# Patient Record
Sex: Female | Born: 1940 | Race: White | Hispanic: No | Smoking: Former smoker
Health system: Southern US, Community
[De-identification: ages and names within clinical notes are randomized; demographics above are authoritative.]

## PROBLEM LIST (undated history)

## (undated) DIAGNOSIS — M1990 Unspecified osteoarthritis, unspecified site: Secondary | ICD-10-CM

## (undated) DIAGNOSIS — T7840XA Allergy, unspecified, initial encounter: Secondary | ICD-10-CM

## (undated) HISTORY — DX: Allergy, unspecified, initial encounter: T78.40XA

## (undated) HISTORY — PX: RECTAL PROLAPSE REPAIR: SHX759

## (undated) HISTORY — PX: PERINEAL PROCTECTOMY: SHX6398

## (undated) HISTORY — PX: ABDOMINAL HYSTERECTOMY: SHX81

## (undated) HISTORY — PX: BREAST SURGERY: SHX581

## (undated) HISTORY — PX: BLADDER REPAIR: SHX76

## (undated) HISTORY — PX: TONSILLECTOMY: SUR1361

## (undated) HISTORY — DX: Unspecified osteoarthritis, unspecified site: M19.90

---

## 2007-11-21 ENCOUNTER — Encounter: Admission: RE | Admit: 2007-11-21 | Discharge: 2007-11-21 | Payer: Self-pay | Admitting: Internal Medicine

## 2008-11-27 ENCOUNTER — Ambulatory Visit: Payer: Self-pay | Admitting: Diagnostic Radiology

## 2008-11-27 ENCOUNTER — Ambulatory Visit (HOSPITAL_BASED_OUTPATIENT_CLINIC_OR_DEPARTMENT_OTHER): Admission: RE | Admit: 2008-11-27 | Discharge: 2008-11-27 | Payer: Self-pay | Admitting: Obstetrics and Gynecology

## 2009-01-14 ENCOUNTER — Emergency Department (HOSPITAL_BASED_OUTPATIENT_CLINIC_OR_DEPARTMENT_OTHER): Admission: EM | Admit: 2009-01-14 | Discharge: 2009-01-14 | Payer: Self-pay | Admitting: Emergency Medicine

## 2009-12-03 ENCOUNTER — Ambulatory Visit (HOSPITAL_BASED_OUTPATIENT_CLINIC_OR_DEPARTMENT_OTHER): Admission: RE | Admit: 2009-12-03 | Discharge: 2009-12-03 | Payer: Self-pay | Admitting: Obstetrics and Gynecology

## 2009-12-03 ENCOUNTER — Ambulatory Visit: Payer: Self-pay | Admitting: Diagnostic Radiology

## 2010-12-09 ENCOUNTER — Ambulatory Visit (HOSPITAL_BASED_OUTPATIENT_CLINIC_OR_DEPARTMENT_OTHER)
Admission: RE | Admit: 2010-12-09 | Discharge: 2010-12-09 | Payer: Self-pay | Source: Home / Self Care | Attending: Obstetrics and Gynecology | Admitting: Obstetrics and Gynecology

## 2010-12-28 ENCOUNTER — Encounter: Payer: Self-pay | Admitting: Obstetrics and Gynecology

## 2011-03-24 LAB — URINALYSIS, ROUTINE W REFLEX MICROSCOPIC
Glucose, UA: NEGATIVE mg/dL
Urobilinogen, UA: 1 mg/dL (ref 0.0–1.0)

## 2011-03-24 LAB — URINE CULTURE: Colony Count: >=100000

## 2011-03-24 LAB — URINE MICROSCOPIC-ADD ON

## 2011-11-16 ENCOUNTER — Other Ambulatory Visit (HOSPITAL_BASED_OUTPATIENT_CLINIC_OR_DEPARTMENT_OTHER): Payer: Self-pay | Admitting: Obstetrics and Gynecology

## 2011-11-16 DIAGNOSIS — Z1231 Encounter for screening mammogram for malignant neoplasm of breast: Secondary | ICD-10-CM

## 2011-12-14 ENCOUNTER — Ambulatory Visit (HOSPITAL_BASED_OUTPATIENT_CLINIC_OR_DEPARTMENT_OTHER)
Admission: RE | Admit: 2011-12-14 | Discharge: 2011-12-14 | Disposition: A | Discharge status: Home / Self Care | Payer: Medicare Other | Source: Ambulatory Visit | Attending: Obstetrics and Gynecology | Admitting: Obstetrics and Gynecology

## 2011-12-14 DIAGNOSIS — Z1231 Encounter for screening mammogram for malignant neoplasm of breast: Secondary | ICD-10-CM | POA: Insufficient documentation

## 2012-11-18 ENCOUNTER — Other Ambulatory Visit (HOSPITAL_BASED_OUTPATIENT_CLINIC_OR_DEPARTMENT_OTHER): Payer: Self-pay | Admitting: Obstetrics and Gynecology

## 2012-11-18 DIAGNOSIS — Z1231 Encounter for screening mammogram for malignant neoplasm of breast: Secondary | ICD-10-CM

## 2012-12-15 ENCOUNTER — Inpatient Hospital Stay (HOSPITAL_BASED_OUTPATIENT_CLINIC_OR_DEPARTMENT_OTHER): Admission: RE | Admit: 2012-12-15 | Payer: Medicare Other | Source: Ambulatory Visit

## 2012-12-15 ENCOUNTER — Ambulatory Visit (HOSPITAL_BASED_OUTPATIENT_CLINIC_OR_DEPARTMENT_OTHER): Payer: Medicare Other

## 2012-12-19 ENCOUNTER — Ambulatory Visit (HOSPITAL_BASED_OUTPATIENT_CLINIC_OR_DEPARTMENT_OTHER)
Admission: RE | Admit: 2012-12-19 | Discharge: 2012-12-19 | Disposition: A | Discharge status: Home / Self Care | Payer: Medicare Other | Source: Ambulatory Visit | Attending: Obstetrics and Gynecology | Admitting: Obstetrics and Gynecology

## 2012-12-19 DIAGNOSIS — Z1231 Encounter for screening mammogram for malignant neoplasm of breast: Secondary | ICD-10-CM | POA: Insufficient documentation

## 2013-11-23 ENCOUNTER — Other Ambulatory Visit (HOSPITAL_BASED_OUTPATIENT_CLINIC_OR_DEPARTMENT_OTHER): Payer: Self-pay | Admitting: Obstetrics and Gynecology

## 2013-11-23 DIAGNOSIS — Z1231 Encounter for screening mammogram for malignant neoplasm of breast: Secondary | ICD-10-CM

## 2013-12-20 ENCOUNTER — Ambulatory Visit (HOSPITAL_BASED_OUTPATIENT_CLINIC_OR_DEPARTMENT_OTHER): Payer: Medicare Other

## 2013-12-27 ENCOUNTER — Ambulatory Visit (HOSPITAL_BASED_OUTPATIENT_CLINIC_OR_DEPARTMENT_OTHER)
Admission: RE | Admit: 2013-12-27 | Discharge: 2013-12-27 | Disposition: A | Discharge status: Home / Self Care | Payer: Medicare Other | Source: Ambulatory Visit | Attending: Obstetrics and Gynecology | Admitting: Obstetrics and Gynecology

## 2013-12-27 DIAGNOSIS — Z1231 Encounter for screening mammogram for malignant neoplasm of breast: Secondary | ICD-10-CM

## 2014-04-22 ENCOUNTER — Encounter (HOSPITAL_BASED_OUTPATIENT_CLINIC_OR_DEPARTMENT_OTHER): Payer: Self-pay | Admitting: Emergency Medicine

## 2014-04-22 ENCOUNTER — Emergency Department (HOSPITAL_BASED_OUTPATIENT_CLINIC_OR_DEPARTMENT_OTHER)
Admission: EM | Admit: 2014-04-22 | Discharge: 2014-04-22 | Disposition: A | Discharge status: Home / Self Care | Payer: Medicare Other | Attending: Emergency Medicine | Admitting: Emergency Medicine

## 2014-04-22 ENCOUNTER — Emergency Department (HOSPITAL_BASED_OUTPATIENT_CLINIC_OR_DEPARTMENT_OTHER): Payer: Medicare Other

## 2014-04-22 DIAGNOSIS — W1809XA Striking against other object with subsequent fall, initial encounter: Secondary | ICD-10-CM | POA: Insufficient documentation

## 2014-04-22 DIAGNOSIS — Y929 Unspecified place or not applicable: Secondary | ICD-10-CM | POA: Insufficient documentation

## 2014-04-22 DIAGNOSIS — S2239XA Fracture of one rib, unspecified side, initial encounter for closed fracture: Secondary | ICD-10-CM | POA: Insufficient documentation

## 2014-04-22 DIAGNOSIS — Y9389 Activity, other specified: Secondary | ICD-10-CM | POA: Insufficient documentation

## 2014-04-22 MED ORDER — OXYCODONE-ACETAMINOPHEN 5-325 MG PO TABS: 1.0000 | ORAL_TABLET | Freq: Four times a day (QID) | ORAL | Status: DC | PRN

## 2014-04-22 NOTE — ED Notes (Addendum)
Patient c/o R side upper rib pain as a result of fall last Wednesday and hit her R side upper back on a door frame, no loc. Took 400mg  advil at 12:30

## 2014-04-22 NOTE — ED Provider Notes (Signed)
CSN: [id number]     Arrival date & time 04/22/14  1426 History   First MD Initiated Contact with Patient 04/22/14 1500     Chief Complaint  Patient presents with  . Fall     (Consider location/radiation/quality/duration/timing/severity/associated sxs/prior Treatment) HPI Lost balance 5 days ago hit right ribs when assisting husband; ibuprofen helping; pain constant, moderate, localized, nonradiating, no associated Sxs, no SOB, abd pain or back pain. No hematuria or bloody stools. No head or neck injury, no extremity injury.    No past medical history on file. Past Surgical History  Procedure Laterality Date  . Tonsillectomy    . Abdominal hysterectomy      aprtial   No family history on file. History  Substance Use Topics  . Smoking status: Never Smoker   . Smokeless tobacco: Not on file  . Alcohol Use: Yes   OB History   Grav Para Term Preterm Abortions TAB SAB Ect Mult Living                 Review of Systems 10 Systems reviewed and are negative for acute change except as noted in the HPI.   Allergies  Review of patient's allergies indicates no known allergies.  Home Medications   Prior to Admission medications   Medication Sig Start Date End Date Taking? Authorizing Provider  Cholecalciferol (VITAMIN D-3 PO) Take by mouth.   Yes Historical Provider, MD  Zoledronic Acid (RECLAST IV) Inject into the vein once. Yearly   Yes Historical Provider, MD   BP 159/82  Pulse 76  Temp(Src) 97.7 F (36.5 C) (Oral)  Resp 20  Ht 5\' 4"  (1.626 m)  Wt 136 lb (61.689 kg)  BMI 23.33 kg/m2  SpO2 100% Physical Exam  Nursing note and vitals reviewed. Constitutional:  Awake, alert, nontoxic appearance.  HENT:  Head: Atraumatic.  Eyes: Right eye exhibits no discharge. Left eye exhibits no discharge.  Neck: Neck supple.  C-S NT  Cardiovascular: Normal rate and regular rhythm.   No murmur heard. Pulmonary/Chest: Effort normal and breath sounds normal. No respiratory  distress. She has no wheezes. She has no rales. She exhibits tenderness.  tender right lower chest wall posterolateral; pulse ox normal room air 100%  Abdominal: Soft. Bowel sounds are normal. She exhibits no distension. There is no tenderness. There is no rebound and no guarding.  Musculoskeletal: She exhibits no tenderness.  Baseline ROM, no obvious new focal weakness. Back NT.  Neurological:  Mental status and motor strength appears baseline for patient and situation.  Skin: No rash noted.  Psychiatric: She has a normal mood and affect.    ED Course  Procedures (including critical care time) Patient / Family / Caregiver informed of clinical course, understand medical decision-making process, and agree with plan. Labs Review Labs Reviewed - No data to display  Imaging Review Dg Chest 2 View  04/22/2014   CLINICAL DATA:  Status post fall  EXAM: CHEST  2 VIEW  COMPARISON:  None  FINDINGS: The heart size and mediastinal contours are within normal limits. Moderate hiatal hernia noted. Both lungs are clear. The visualized skeletal structures are unremarkable.  IMPRESSION: 1. No acute findings. 2. Hiatal hernia.   Electronically Signed   By: Kerby Moors M.D.   On: 04/22/2014 15:49     EKG Interpretation None      MDM   Final diagnoses:  Rib fracture    I doubt any other EMC precluding discharge at this time including, but not  necessarily limited to the following:PTX, abdominal injury.    Babette Relic, MD 04/24/14 [phone]

## 2014-04-22 NOTE — Discharge Instructions (Signed)
Rib Fracture A rib fracture is a break or crack in one of the bones of the ribs. The ribs are like a cage that goes around your upper chest. A broken or cracked rib is often painful, but most do not cause other problems. Most rib fractures heal on their own in 1 3 months. HOME CARE  Avoid activities that cause pain to the injured area. Protect your injured area.  Slowly increase activity as told by your doctor.  Take medicine as told by your doctor.  Put ice on the injured area for the first 1 2 days after you have been treated or as told by your doctor.  Put ice in a plastic bag.  Place a towel between your skin and the bag.  Leave the ice on for 15 20 minutes at a time, every 2 hours while you are awake.  Do deep breathing as told by your doctor. You may be told to:  Take deep breaths many times a day.  Cough many times a day while hugging a pillow.  Use a device (incentive spirometer) to perform deep breathing many times a day.  Drink enough fluids to keep your pee (urine) clear or pale yellow.   Do not wear a rib belt or binder. These do not allow you to breathe deeply. GET HELP RIGHT AWAY IF:   You have a fever.  You have trouble breathing.   You cannot stop coughing.  You cough up thick or bloody spit (mucus).   You feel sick to your stomach (nauseous), throw up (vomit), or have belly (abdominal) pain.   Your pain gets worse and medicine does not help.  MAKE SURE YOU:   Understand these instructions.  Will watch your condition.  Will get help right away if you are not doing well or get worse. Document Released: 09/01/2008 Document Revised: 03/20/2013 Document Reviewed: 01/25/2013 [facility] Patient Information 2014 [place], [place].

## 2014-12-10 ENCOUNTER — Other Ambulatory Visit (HOSPITAL_BASED_OUTPATIENT_CLINIC_OR_DEPARTMENT_OTHER): Payer: Self-pay | Admitting: Internal Medicine

## 2014-12-10 DIAGNOSIS — Z1231 Encounter for screening mammogram for malignant neoplasm of breast: Secondary | ICD-10-CM

## 2014-12-28 ENCOUNTER — Ambulatory Visit (HOSPITAL_BASED_OUTPATIENT_CLINIC_OR_DEPARTMENT_OTHER): Payer: Medicare Other

## 2015-01-01 ENCOUNTER — Ambulatory Visit (HOSPITAL_BASED_OUTPATIENT_CLINIC_OR_DEPARTMENT_OTHER)
Admission: RE | Admit: 2015-01-01 | Discharge: 2015-01-01 | Disposition: A | Discharge status: Home / Self Care | Payer: Medicare Other | Source: Ambulatory Visit | Attending: Internal Medicine | Admitting: Internal Medicine

## 2015-01-01 DIAGNOSIS — Z1231 Encounter for screening mammogram for malignant neoplasm of breast: Secondary | ICD-10-CM | POA: Insufficient documentation

## 2015-11-25 ENCOUNTER — Other Ambulatory Visit (HOSPITAL_BASED_OUTPATIENT_CLINIC_OR_DEPARTMENT_OTHER): Payer: Self-pay | Admitting: Obstetrics and Gynecology

## 2015-11-25 DIAGNOSIS — Z1231 Encounter for screening mammogram for malignant neoplasm of breast: Secondary | ICD-10-CM

## 2016-01-06 ENCOUNTER — Ambulatory Visit (HOSPITAL_BASED_OUTPATIENT_CLINIC_OR_DEPARTMENT_OTHER)
Admission: RE | Admit: 2016-01-06 | Discharge: 2016-01-06 | Disposition: A | Discharge status: Home / Self Care | Payer: Medicare Other | Source: Ambulatory Visit | Attending: Obstetrics and Gynecology | Admitting: Obstetrics and Gynecology

## 2016-01-06 DIAGNOSIS — Z1231 Encounter for screening mammogram for malignant neoplasm of breast: Secondary | ICD-10-CM | POA: Diagnosis not present

## 2016-04-30 DIAGNOSIS — K580 Irritable bowel syndrome with diarrhea: Secondary | ICD-10-CM | POA: Insufficient documentation

## 2016-12-10 DIAGNOSIS — N993 Prolapse of vaginal vault after hysterectomy: Secondary | ICD-10-CM | POA: Insufficient documentation

## 2017-04-07 ENCOUNTER — Other Ambulatory Visit (HOSPITAL_BASED_OUTPATIENT_CLINIC_OR_DEPARTMENT_OTHER): Payer: Self-pay | Admitting: Obstetrics and Gynecology

## 2017-04-07 DIAGNOSIS — Z1231 Encounter for screening mammogram for malignant neoplasm of breast: Secondary | ICD-10-CM

## 2017-04-13 ENCOUNTER — Ambulatory Visit (HOSPITAL_BASED_OUTPATIENT_CLINIC_OR_DEPARTMENT_OTHER)
Admission: RE | Admit: 2017-04-13 | Discharge: 2017-04-13 | Disposition: A | Discharge status: Home / Self Care | Payer: Medicare PPO | Source: Ambulatory Visit | Attending: Obstetrics and Gynecology | Admitting: Obstetrics and Gynecology

## 2017-04-13 DIAGNOSIS — Z1231 Encounter for screening mammogram for malignant neoplasm of breast: Secondary | ICD-10-CM | POA: Insufficient documentation

## 2017-09-28 ENCOUNTER — Encounter (HOSPITAL_BASED_OUTPATIENT_CLINIC_OR_DEPARTMENT_OTHER): Payer: Self-pay | Admitting: *Deleted

## 2017-09-28 ENCOUNTER — Emergency Department (HOSPITAL_BASED_OUTPATIENT_CLINIC_OR_DEPARTMENT_OTHER)
Admission: EM | Admit: 2017-09-28 | Discharge: 2017-09-28 | Disposition: A | Discharge status: Home / Self Care | Payer: Medicare PPO | Attending: Emergency Medicine | Admitting: Emergency Medicine

## 2017-09-28 DIAGNOSIS — I10 Essential (primary) hypertension: Secondary | ICD-10-CM

## 2017-09-28 DIAGNOSIS — R030 Elevated blood-pressure reading, without diagnosis of hypertension: Secondary | ICD-10-CM | POA: Diagnosis not present

## 2017-09-28 NOTE — ED Provider Notes (Signed)
TIME SEEN: 11:22 PM  CHIEF COMPLAINT: "my blood pressure is high"  HPI: Pt is a 76 year old female with no significant past medical history who presents to the emergency department with concerns of hypertension. She states that she checks her blood pressure normally every other day and is normally in the 120s to 140s/70s to 90s. She is not on a medication for her blood pressure. She states she is feeling well. Has had a mild "cold" and has had some head "tightness". No severe headache. No vision changes. No chest pain or shortness of breath. No numbness or focal weakness. She is not on medication for her blood pressure. States tonight when she checked her blood pressure was elevated in the 170s/100s and this made her concerned. She called her friend who lives several doors down from her who came over with her own blood pressure machine and they checked it again. She reports after checking it several times it continued to go up and they decided to come to the emergency department.  ROS: See HPI Constitutional: no fever  Eyes: no drainage  ENT: no runny nose   Cardiovascular:  no chest pain  Resp: no SOB  GI: no vomiting GU: no dysuria Integumentary: no rash  Allergy: no hives  Musculoskeletal: no leg swelling  Neurological: no slurred speech ROS otherwise negative  PAST MEDICAL HISTORY/PAST SURGICAL HISTORY:  History reviewed. No pertinent past medical history.  MEDICATIONS:  Prior to Admission medications   Medication Sig Start Date End Date Taking? Authorizing Provider  Cholecalciferol (VITAMIN D-3 PO) Take by mouth.   Yes [provider]  Zoledronic Acid (RECLAST IV) Inject into the vein once. Yearly   Yes [provider]  oxyCODONE-acetaminophen (PERCOCET) 5-325 MG per tablet Take 1 tablet by mouth every 6 (six) hours as needed for severe pain. 04/22/14   Riki Altes, MD    ALLERGIES:  No Known Allergies  SOCIAL HISTORY:  Social History  Substance Use Topics   . Smoking status: Never Smoker  . Smokeless tobacco: Never Used  . Alcohol use Yes    FAMILY HISTORY: No family history on file.  EXAM: BP (!) 197/118   Pulse 93   Temp 97.8 F (36.6 C) (Oral)   Resp 18   Ht 5\' 5"  (1.651 m)   Wt 60.8 kg (134 lb)   SpO2 96%   BMI 22.30 kg/m  CONSTITUTIONAL: Alert and oriented and responds appropriately to questions. Well-appearing; well-nourished, elderly, extremely well-appearing, smiling and laughing, in no distress HEAD: Normocephalic EYES: Conjunctivae clear, pupils appear equal, EOMI ENT: normal nose; moist mucous membranes NECK: Supple, no meningismus, no nuchal rigidity, no LAD  CARD: RRR; S1 and S2 appreciated; no murmurs, no clicks, no rubs, no gallops RESP: Normal chest excursion without splinting or tachypnea; breath sounds clear and equal bilaterally; no wheezes, no rhonchi, no rales, no hypoxia or respiratory distress, speaking full sentences ABD/GI: Normal bowel sounds; non-distended; soft, non-tender, no rebound, no guarding, no peritoneal signs, no hepatosplenomegaly BACK:  The back appears normal and is non-tender to palpation, there is no CVA tenderness EXT: Normal ROM in all joints; non-tender to palpation; no edema; normal capillary refill; no cyanosis, no calf tenderness or swelling    SKIN: Normal color for age and race; warm; no rash NEURO: Moves all extremities equally; Strength 5/5 in all four extremities.  Normal sensation diffusely.  CN 2-12 grossly intact.  No dysmetria to finger to nose testing bilaterally.  Normal speech.  Normal gait. PSYCH: The  patient's mood and manner are appropriate. Grooming and personal hygiene are appropriate.  MEDICAL DECISION MAKING: Pt here with asymptomatic hypertension. EKG obtained in triage shows no arrhythmia, ischemic abnormality.  Patient is extremely well-appearing, neurologically intact. I do not feel at this time she needs further workup. Her blood pressure continues to improve on  its own especially after reassurance. Blood pressure is now 158/84. Discussed with patient that given she regularly has normal blood pressures and do not feel that we should start her on blood pressure medication for this isolated episode of hypertension. Recommended close follow-up with her primary care doctor for this and that she check her blood pressure only once a day in the morning when she is feeling well. Stressed with her that it is normal to have your blood pressure continued to go up after checking it many times in a row and with her being anxious about it. I discussed at length return precautions with patient and her friend. Discussed return precautions. I do not feel this time she needs labs or urine as I'm not concerned for any end organ damage from 1 day of slightly elevated pressure.  She states that yesterday her blood pressure was normal and that earlier today it was also in the 130s/80s.  I discussed with her that if we were to start her on blood pressure medication for these isolated episodes of hypertension that we can actually make her hypotensive which could be very dangerous for her. She is comfortable with this plan for close outpatient follow-up.   At this time, I do not feel there is any life-threatening condition present. I have reviewed and discussed all results (EKG, imaging, lab, urine as appropriate) and exam findings with patient/family. I have reviewed nursing notes and appropriate previous records.  I feel the patient is safe to be discharged home without further emergent workup and can continue workup as an outpatient as needed. Discussed usual and customary return precautions. Patient/family verbalize understanding and are comfortable with this plan.  Outpatient follow-up has been provided if needed. All questions have been answered.      EKG Interpretation  Date/Time:  Tuesday September 28 2017 22:57:56 EDT Ventricular Rate:  87 PR Interval:    QRS Duration: 100 QT  Interval:  394 QTC Calculation: 474 R Axis:   36 Text Interpretation:  Sinus rhythm No old tracing to compare Confirmed by Ward, Cyril Mourning [phone]) on 09/28/2017 11:02:00 PM         Ward, Delice Bison, DO 09/29/17 0814

## 2017-09-28 NOTE — ED Triage Notes (Signed)
States she took her BP at home and it was elevated. Her head has been stuffy x 2 days. No other symptoms. No hx of HTN.

## 2018-01-28 DIAGNOSIS — K623 Rectal prolapse: Secondary | ICD-10-CM | POA: Insufficient documentation

## 2018-03-28 ENCOUNTER — Other Ambulatory Visit (HOSPITAL_BASED_OUTPATIENT_CLINIC_OR_DEPARTMENT_OTHER): Payer: Self-pay | Admitting: Obstetrics and Gynecology

## 2018-03-28 DIAGNOSIS — Z1231 Encounter for screening mammogram for malignant neoplasm of breast: Secondary | ICD-10-CM

## 2018-04-28 ENCOUNTER — Ambulatory Visit (HOSPITAL_BASED_OUTPATIENT_CLINIC_OR_DEPARTMENT_OTHER)
Admission: RE | Admit: 2018-04-28 | Discharge: 2018-04-28 | Disposition: A | Discharge status: Home / Self Care | Payer: Medicare PPO | Source: Ambulatory Visit | Attending: Obstetrics and Gynecology | Admitting: Obstetrics and Gynecology

## 2018-04-28 DIAGNOSIS — Z1231 Encounter for screening mammogram for malignant neoplasm of breast: Secondary | ICD-10-CM | POA: Insufficient documentation

## 2018-05-06 DIAGNOSIS — M19019 Primary osteoarthritis, unspecified shoulder: Secondary | ICD-10-CM | POA: Insufficient documentation

## 2018-05-30 DIAGNOSIS — D509 Iron deficiency anemia, unspecified: Secondary | ICD-10-CM | POA: Insufficient documentation

## 2019-05-05 ENCOUNTER — Other Ambulatory Visit (HOSPITAL_BASED_OUTPATIENT_CLINIC_OR_DEPARTMENT_OTHER): Payer: Self-pay | Admitting: Psychology

## 2019-05-05 ENCOUNTER — Other Ambulatory Visit (HOSPITAL_BASED_OUTPATIENT_CLINIC_OR_DEPARTMENT_OTHER): Payer: Self-pay | Admitting: Obstetrics and Gynecology

## 2019-05-05 ENCOUNTER — Other Ambulatory Visit (HOSPITAL_BASED_OUTPATIENT_CLINIC_OR_DEPARTMENT_OTHER): Payer: Self-pay | Admitting: Internal Medicine

## 2019-05-05 DIAGNOSIS — Z1231 Encounter for screening mammogram for malignant neoplasm of breast: Secondary | ICD-10-CM

## 2019-05-15 ENCOUNTER — Encounter (HOSPITAL_BASED_OUTPATIENT_CLINIC_OR_DEPARTMENT_OTHER): Payer: Self-pay

## 2019-05-15 ENCOUNTER — Ambulatory Visit (HOSPITAL_BASED_OUTPATIENT_CLINIC_OR_DEPARTMENT_OTHER)
Admission: RE | Admit: 2019-05-15 | Discharge: 2019-05-15 | Disposition: A | Discharge status: Home / Self Care | Payer: Medicare HMO | Source: Ambulatory Visit | Attending: Obstetrics and Gynecology | Admitting: Obstetrics and Gynecology

## 2019-05-15 ENCOUNTER — Other Ambulatory Visit: Payer: Self-pay

## 2019-05-15 DIAGNOSIS — Z1231 Encounter for screening mammogram for malignant neoplasm of breast: Secondary | ICD-10-CM | POA: Insufficient documentation

## 2019-07-11 DIAGNOSIS — N189 Chronic kidney disease, unspecified: Secondary | ICD-10-CM | POA: Insufficient documentation

## 2019-07-11 DIAGNOSIS — D649 Anemia, unspecified: Secondary | ICD-10-CM | POA: Insufficient documentation

## 2019-07-11 DIAGNOSIS — R030 Elevated blood-pressure reading, without diagnosis of hypertension: Secondary | ICD-10-CM | POA: Insufficient documentation

## 2019-07-11 DIAGNOSIS — I10 Essential (primary) hypertension: Secondary | ICD-10-CM | POA: Insufficient documentation

## 2019-07-11 DIAGNOSIS — E559 Vitamin D deficiency, unspecified: Secondary | ICD-10-CM | POA: Insufficient documentation

## 2019-09-28 DIAGNOSIS — E785 Hyperlipidemia, unspecified: Secondary | ICD-10-CM | POA: Insufficient documentation

## 2019-09-28 DIAGNOSIS — E7800 Pure hypercholesterolemia, unspecified: Secondary | ICD-10-CM | POA: Insufficient documentation

## 2020-01-03 ENCOUNTER — Ambulatory Visit: Payer: Medicare HMO

## 2020-01-12 ENCOUNTER — Ambulatory Visit: Payer: Medicare HMO | Attending: Internal Medicine

## 2020-01-12 DIAGNOSIS — Z23 Encounter for immunization: Secondary | ICD-10-CM | POA: Insufficient documentation

## 2020-01-12 NOTE — Progress Notes (Signed)
   Covid-19 Vaccination Clinic  Name:  Zhanna Baal    MRN: [id number] DOB: May 12, 1941  01/12/2020  Ms. Hiney was observed post Covid-19 immunization for 15 minutes without incidence. She was provided with Vaccine Information Sheet and instruction to access the V-Safe system.   Ms. Roeder was instructed to call 911 with any severe reactions post vaccine: Marland Kitchen Difficulty breathing  . Swelling of your face and throat  . A fast heartbeat  . A bad rash all over your body  . Dizziness and weakness    Immunizations Administered    Name Date Dose VIS Date Route   Pfizer COVID-19 Vaccine 01/12/2020 12:53 PM 0.3 mL 11/17/2019 Intramuscular   Manufacturer: [place]   Lot: [id number]   [place]: [id number]

## 2020-01-24 ENCOUNTER — Ambulatory Visit: Payer: Medicare HMO

## 2020-02-06 ENCOUNTER — Ambulatory Visit: Payer: Medicare HMO | Attending: Internal Medicine

## 2020-02-06 DIAGNOSIS — Z23 Encounter for immunization: Secondary | ICD-10-CM | POA: Insufficient documentation

## 2020-02-06 NOTE — Progress Notes (Signed)
   Covid-19 Vaccination Clinic  Name:  Shelley Snyder    MRN: [id number] DOB: 1940-12-17  02/06/2020  [name] was observed post Covid-19 immunization for 15 minutes without incident. She was provided with Vaccine Information Sheet and instruction to access the V-Safe system.   [name] was instructed to call 911 with any severe reactions post vaccine: Marland Kitchen Difficulty breathing  . Swelling of face and throat  . A fast heartbeat  . A bad rash all over body  . Dizziness and weakness   Immunizations Administered    Name Date Dose VIS Date Route   Pfizer COVID-19 Vaccine 02/06/2020  1:02 PM 0.3 mL 11/17/2019 Intramuscular   Manufacturer: [place]   Lot: [id number]   [place]: [id number]

## 2020-04-05 ENCOUNTER — Other Ambulatory Visit (HOSPITAL_BASED_OUTPATIENT_CLINIC_OR_DEPARTMENT_OTHER): Payer: Self-pay | Admitting: Internal Medicine

## 2020-04-05 ENCOUNTER — Other Ambulatory Visit (HOSPITAL_BASED_OUTPATIENT_CLINIC_OR_DEPARTMENT_OTHER): Payer: Self-pay | Admitting: Obstetrics and Gynecology

## 2020-04-05 DIAGNOSIS — Z1231 Encounter for screening mammogram for malignant neoplasm of breast: Secondary | ICD-10-CM

## 2020-05-22 ENCOUNTER — Ambulatory Visit (HOSPITAL_BASED_OUTPATIENT_CLINIC_OR_DEPARTMENT_OTHER): Payer: Medicare HMO

## 2020-05-28 ENCOUNTER — Ambulatory Visit (HOSPITAL_BASED_OUTPATIENT_CLINIC_OR_DEPARTMENT_OTHER)
Admission: RE | Admit: 2020-05-28 | Discharge: 2020-05-28 | Disposition: A | Discharge status: Home / Self Care | Payer: Medicare HMO | Source: Ambulatory Visit | Attending: Obstetrics and Gynecology | Admitting: Obstetrics and Gynecology

## 2020-05-28 ENCOUNTER — Other Ambulatory Visit: Payer: Self-pay

## 2020-05-28 DIAGNOSIS — Z1231 Encounter for screening mammogram for malignant neoplasm of breast: Secondary | ICD-10-CM | POA: Diagnosis not present

## 2020-10-22 ENCOUNTER — Other Ambulatory Visit: Payer: Self-pay

## 2020-10-24 ENCOUNTER — Encounter: Payer: Self-pay | Admitting: Family Medicine

## 2020-10-24 ENCOUNTER — Other Ambulatory Visit: Payer: Self-pay

## 2020-10-24 ENCOUNTER — Ambulatory Visit (INDEPENDENT_AMBULATORY_CARE_PROVIDER_SITE_OTHER): Payer: Medicare HMO | Admitting: Family Medicine

## 2020-10-24 VITALS — BP 124/80 | HR 68 | Temp 98.1°F | Ht 63.0 in | Wt 134.0 lb

## 2020-10-24 DIAGNOSIS — M810 Age-related osteoporosis without current pathological fracture: Secondary | ICD-10-CM | POA: Diagnosis not present

## 2020-10-24 DIAGNOSIS — Z1321 Encounter for screening for nutritional disorder: Secondary | ICD-10-CM | POA: Diagnosis not present

## 2020-10-24 DIAGNOSIS — Z13220 Encounter for screening for lipoid disorders: Secondary | ICD-10-CM

## 2020-10-24 DIAGNOSIS — Z862 Personal history of diseases of the blood and blood-forming organs and certain disorders involving the immune mechanism: Secondary | ICD-10-CM | POA: Diagnosis not present

## 2020-10-24 LAB — CBC
HCT: 39.6 % (ref 36.0–46.0)
Hemoglobin: 13 g/dL (ref 12.0–15.0)
MCHC: 33 g/dL (ref 30.0–36.0)
MCV: 94.8 fl (ref 78.0–100.0)
Platelets: 194 10*3/uL (ref 150.0–400.0)
RBC: 4.18 Mil/uL (ref 3.87–5.11)
RDW: 14.3 % (ref 11.5–15.5)
WBC: 3.8 10*3/uL — ABNORMAL LOW (ref 4.0–10.5)

## 2020-10-24 LAB — LIPID PANEL
Cholesterol: 211 mg/dL — ABNORMAL HIGH (ref 0–200)
HDL: 89.1 mg/dL (ref >39.00)
LDL Cholesterol: 112 mg/dL — ABNORMAL HIGH (ref 0–99)
NonHDL: 122.14
Total CHOL/HDL Ratio: 2
Triglycerides: 51 mg/dL (ref 0.0–149.0)
VLDL: 10.2 mg/dL (ref 0.0–40.0)

## 2020-10-24 LAB — COMPREHENSIVE METABOLIC PANEL
ALT: 15 U/L (ref 0–35)
AST: 20 U/L (ref 0–37)
Albumin: 4 g/dL (ref 3.5–5.2)
Alkaline Phosphatase: 60 U/L (ref 39–117)
BUN: 20 mg/dL (ref 6–23)
CO2: 29 mEq/L (ref 19–32)
Calcium: 9 mg/dL (ref 8.4–10.5)
Chloride: 106 mEq/L (ref 96–112)
Creatinine, Ser: 0.84 mg/dL (ref 0.40–1.20)
GFR: 65.63 mL/min (ref >60.00)
Glucose, Bld: 85 mg/dL (ref 70–99)
Potassium: 4.2 mEq/L (ref 3.5–5.1)
Sodium: 141 mEq/L (ref 135–145)
Total Bilirubin: 0.6 mg/dL (ref 0.2–1.2)
Total Protein: 6.7 g/dL (ref 6.0–8.3)

## 2020-10-24 LAB — VITAMIN D 25 HYDROXY (VIT D DEFICIENCY, FRACTURES): VITD: 84.14 ng/mL (ref 30.00–100.00)

## 2020-10-24 NOTE — Progress Notes (Signed)
Shelley Snyder is a 79 y.o. female  Chief Complaint  Patient presents with  . Establish Care    NP- establish care     HPI: Shelley Snyder is a 79 y.o. female here as a new patient to establish care with our office. Previous PCP Dr. Thornton Dales with [phone] Internal Medicine.   Specialists: Urogyn (Dr. 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 Allergy     Past Surgical History:  Procedure Laterality Date  . ABDOMINAL HYSTERECTOMY     aprtial  . BREAST SURGERY    . TONSILLECTOMY      Social History   Socioeconomic History  . Marital status: Widowed    Spouse name: Not on file  . Number of children: Not on file  . Years of education: Not on file  . Highest education level: Not on file  Occupational History  . Not on file  Tobacco Use  . Smoking status: Never Smoker  . Smokeless tobacco: Never Used  Vaping Use  . Vaping Use: Never used  Substance and Sexual Activity  . Alcohol use: Yes    Comment: social  . Drug use: No  . Sexual activity: Not Currently  Other Topics Concern  . Not on file  Social History Narrative  . Not on file   Social Determinants of Health   Financial Resource Strain:   . Difficulty of Paying Living Expenses: Not on file  Food Insecurity:   . Worried About Charity fundraiser in the Last Year: Not on file  . Ran Out of Food in the Last Year: Not on file  Transportation Needs:   . Lack of Transportation (Medical): Not on file  . Lack of Transportation (Non-Medical): Not on file  Physical Activity:   . Days of Exercise per Week: Not on file  . Minutes of Exercise per Session: Not on file  Stress:   . Feeling of Stress : Not on  file  Social Connections:   . Frequency of Communication with Friends and Family: Not on file  . Frequency of Social Gatherings with Friends and Family: Not on file  . Attends Religious Services: Not on file  . Active Member of Clubs or Organizations: Not on file  . Attends Archivist Meetings: Not on file  . Marital Status: Not on file  Intimate Partner Violence:   . Fear of Current or Ex-Partner: Not on file  . Emotionally Abused: Not on file  . Physically Abused: Not on file  . Sexually Abused: Not on file    No family history on file.   Immunization History  Administered Date(s) Administered  . Influenza-Unspecified 09/06/2020  . PFIZER SARS-COV-2 Vaccination 01/12/2020, 02/06/2020, 09/25/2020    Outpatient Encounter Medications as of 10/24/2020  Medication Sig  . Ascorbic Acid (VITAMIN C) 1000 MG tablet Take 1,000 mg by mouth daily.  . Calcium Carb-Cholecalciferol 600-800 MG-UNIT TABS Take 2 tablets by mouth daily.  . Cholecalciferol (VITAMIN D-3 PO) Take by mouth.  . lactobacillus acidophilus (BACID) TABS tablet Take 2 tablets by mouth 3 (three) times daily.  . Melatonin 10 MG TABS Take by mouth.  . Multiple Vitamins-Minerals (MULTIVITAMIN  ADULTS 50+) TABS Take by mouth.  . [DISCONTINUED] Cholecalciferol 25 MCG (1000 UT) tablet Take by mouth.  . [DISCONTINUED] oxyCODONE-acetaminophen (PERCOCET) 5-325 MG per tablet Take 1 tablet by mouth every 6 (six) hours as needed for severe pain. (Patient not taking: Reported on 10/24/2020)  . [DISCONTINUED] Zoledronic Acid (RECLAST IV) Inject into the vein once. Yearly (Patient not taking: Reported on 10/24/2020)   No facility-administered encounter medications on file as of 10/24/2020.     ROS: Pertinent positives and negatives noted in HPI. Remainder of ROS non-contributory    No Known Allergies  BP 124/80   Pulse 68   Temp 98.1 F (36.7 C) (Temporal)   Ht 5\' 3"  (1.6 m)   Wt 134 lb (60.8 kg)   SpO2 98%   BMI  23.74 kg/m   Physical Exam Constitutional:      General: She is not in acute distress.    Appearance: Normal appearance. She is normal weight. She is not ill-appearing.  Cardiovascular:     Rate and Rhythm: Normal rate and regular rhythm.     Pulses: Normal pulses.  Pulmonary:     Effort: Pulmonary effort is normal.     Breath sounds: Normal breath sounds. No wheezing.  Abdominal:     General: Bowel sounds are normal. There is no distension.     Tenderness: There is no abdominal tenderness.  Musculoskeletal:     Right lower leg: No edema.     Left lower leg: No edema.  Lymphadenopathy:     Cervical: No cervical adenopathy.  Neurological:     General: No focal deficit present.     Mental Status: She is alert and oriented to person, place, and time.  Psychiatric:        Mood and Affect: Mood normal.        Behavior: Behavior normal.      A/P:  1. Age-related osteoporosis without current pathological fracture - on Vit D and calcium daily, was on reclast in the past - due for dexa in 05/2021 - VITAMIN D 25 Hydroxy (Vit-D Deficiency, Fractures)  2. History of iron deficiency anemia - related to 2 years of microscopic colitis - CBC - Iron, TIBC and Ferritin Panel - Comprehensive metabolic panel  3. Encounter for vitamin deficiency screening - VITAMIN D 25 Hydroxy (Vit-D Deficiency, Fractures)  4. Screening for lipid disorders - Lipid panel - Comprehensive metabolic panel   This visit occurred during the SARS-CoV-2 public health emergency.  Safety protocols were in place, including screening questions prior to the visit, additional usage of staff PPE, and extensive cleaning of exam room while observing appropriate contact time as indicated for disinfecting solutions.

## 2020-10-25 ENCOUNTER — Encounter: Payer: Self-pay | Admitting: Family Medicine

## 2020-10-25 LAB — IRON,TIBC AND FERRITIN PANEL
%SAT: 25 % (calc) (ref 16–45)
Ferritin: 60 ng/mL (ref 16–288)
Iron: 84 ug/dL (ref 45–160)
TIBC: 332 mcg/dL (calc) (ref 250–450)

## 2020-11-14 ENCOUNTER — Encounter: Payer: Medicare HMO | Admitting: Family Medicine

## 2020-11-15 ENCOUNTER — Telehealth: Payer: Self-pay | Admitting: Family Medicine

## 2020-11-15 NOTE — Telephone Encounter (Signed)
Left message for patient to schedule Annual Wellness Visit.  Please schedule with Nurse Health Advisor Martha Stanley, RN at [facility]  °

## 2021-01-08 DIAGNOSIS — N816 Rectocele: Secondary | ICD-10-CM | POA: Diagnosis not present

## 2021-01-08 DIAGNOSIS — R69 Illness, unspecified: Secondary | ICD-10-CM | POA: Diagnosis not present

## 2021-01-08 DIAGNOSIS — Z01419 Encounter for gynecological examination (general) (routine) without abnormal findings: Secondary | ICD-10-CM | POA: Diagnosis not present

## 2021-01-08 DIAGNOSIS — N8110 Cystocele, unspecified: Secondary | ICD-10-CM | POA: Diagnosis not present

## 2021-01-08 DIAGNOSIS — K623 Rectal prolapse: Secondary | ICD-10-CM | POA: Diagnosis not present

## 2021-01-08 DIAGNOSIS — R87610 Atypical squamous cells of undetermined significance on cytologic smear of cervix (ASC-US): Secondary | ICD-10-CM | POA: Diagnosis not present

## 2021-01-08 DIAGNOSIS — S30814A Abrasion of vagina and vulva, initial encounter: Secondary | ICD-10-CM | POA: Diagnosis not present

## 2021-01-13 DIAGNOSIS — R87610 Atypical squamous cells of undetermined significance on cytologic smear of cervix (ASC-US): Secondary | ICD-10-CM | POA: Insufficient documentation

## 2021-03-27 DIAGNOSIS — C44319 Basal cell carcinoma of skin of other parts of face: Secondary | ICD-10-CM | POA: Diagnosis not present

## 2021-03-27 DIAGNOSIS — L820 Inflamed seborrheic keratosis: Secondary | ICD-10-CM | POA: Diagnosis not present

## 2021-04-06 ENCOUNTER — Emergency Department (HOSPITAL_BASED_OUTPATIENT_CLINIC_OR_DEPARTMENT_OTHER)
Admission: EM | Admit: 2021-04-06 | Discharge: 2021-04-06 | Disposition: A | Discharge status: Home / Self Care | Payer: Medicare HMO | Attending: Emergency Medicine | Admitting: Emergency Medicine

## 2021-04-06 ENCOUNTER — Other Ambulatory Visit: Payer: Self-pay

## 2021-04-06 ENCOUNTER — Emergency Department (HOSPITAL_BASED_OUTPATIENT_CLINIC_OR_DEPARTMENT_OTHER): Payer: Medicare HMO

## 2021-04-06 ENCOUNTER — Encounter (HOSPITAL_BASED_OUTPATIENT_CLINIC_OR_DEPARTMENT_OTHER): Payer: Self-pay | Admitting: Emergency Medicine

## 2021-04-06 DIAGNOSIS — M533 Sacrococcygeal disorders, not elsewhere classified: Secondary | ICD-10-CM | POA: Insufficient documentation

## 2021-04-06 DIAGNOSIS — M545 Low back pain, unspecified: Secondary | ICD-10-CM | POA: Diagnosis not present

## 2021-04-06 DIAGNOSIS — M5459 Other low back pain: Secondary | ICD-10-CM | POA: Diagnosis not present

## 2021-04-06 MED ORDER — TRAMADOL HCL 50 MG PO TABS: 50.0000 mg | ORAL_TABLET | Freq: Four times a day (QID) | ORAL | 0 refills | Status: DC | PRN

## 2021-04-06 NOTE — Discharge Instructions (Addendum)
Use the tramadol and Voltaren gel to the area for symptomatic relief.  Follow-up with your primary care doctor this week.  It is recommended that you get an MRI to better assess the compression fractures.  Return to the emergency room if you have any worsening symptoms including numbness or weakness to the leg or any worsening pain or other symptoms.

## 2021-04-06 NOTE — ED Triage Notes (Signed)
Pt was putting on her socks and felt a shooting pain in her right buttocks and lower back. Pt pain has not gotten better. Pt states that she only feels pain with exertion and is fine when she is stiing or lying down

## 2021-04-06 NOTE — ED Notes (Signed)
Back from X/R.

## 2021-04-06 NOTE — ED Provider Notes (Signed)
[facility] EMERGENCY DEPARTMENT Provider Note   CSN: [id number] Arrival date & time: 04/06/21  0728     History Chief Complaint  Patient presents with  . Back Pain    Shelley Snyder is a 80 y.o. female.  Patient is a 79 year old female who presents with back pain.  She has a history of osteoporosis.  She states that 2 days ago she was lifting her right leg up to put her sock on and felt a shooting pain in her right lower back/buttocks area.  There is no radiation down her leg.  No numbness or weakness to her extremity.  No loss of bowel or bladder control.  No fevers.  She says it hurts when she tries to turn or when she is first getting up.  When she is up and walking it feels better.  No history of similar symptoms in the past.  She is taken Tylenol with some improvement in symptoms although this morning it was worse when she first woke up.        Past Medical History:  Diagnosis Date  . Allergy     Patient Active Problem List   Diagnosis Date Noted  . Age-related osteoporosis without current pathological fracture 10/24/2020    Past Surgical History:  Procedure Laterality Date  . ABDOMINAL HYSTERECTOMY     aprtial  . BREAST SURGERY    . TONSILLECTOMY       OB History   No obstetric history on file.     No family history on file.  Social History   Tobacco Use  . Smoking status: Never Smoker  . Smokeless tobacco: Never Used  Vaping Use  . Vaping Use: Never used  Substance Use Topics  . Alcohol use: Yes    Comment: social  . Drug use: No    Home Medications Prior to Admission medications   Medication Sig Start Date End Date Taking? Authorizing Provider  Ascorbic Acid (VITAMIN C) 1000 MG tablet Take 1,000 mg by mouth daily.   Yes [provider]  Calcium Carb-Cholecalciferol 600-800 MG-UNIT TABS Take 2 tablets by mouth daily.   Yes [provider]  Cholecalciferol (VITAMIN D-3 PO) Take by mouth.   Yes [provider]   lactobacillus acidophilus (BACID) TABS tablet Take 2 tablets by mouth 3 (three) times daily.   Yes [provider]  Melatonin 10 MG TABS Take by mouth.   Yes [provider]  Multiple Vitamins-Minerals (MULTIVITAMIN ADULTS 50+) TABS Take by mouth.   Yes [provider]  traMADol (ULTRAM) 50 MG tablet Take 1 tablet (50 mg total) by mouth every 6 (six) hours as needed. 04/06/21  Yes Malvin Johns, MD    Allergies    Patient has no known allergies.  Review of Systems   Review of Systems  Constitutional: Negative for fever.  Gastrointestinal: Negative for nausea and vomiting.  Musculoskeletal: Positive for back pain. Negative for arthralgias, joint swelling and neck pain.  Skin: Negative for wound.  Neurological: Negative for weakness, numbness and headaches.    Physical Exam Updated Vital Signs BP (!) 154/98 (BP Location: Right Arm)   Pulse 86   Temp 98 F (36.7 C) (Oral)   Resp 18   Ht 5\' 4"  (1.626 m)   Wt 60.8 kg   SpO2 100%   BMI 23.00 kg/m   Physical Exam Constitutional:      Appearance: She is well-developed.  HENT:     Head: Normocephalic and atraumatic.  Cardiovascular:  Rate and Rhythm: Normal rate.  Pulmonary:     Effort: Pulmonary effort is normal.  Musculoskeletal:        General: Tenderness present.     Cervical back: Normal range of motion and neck supple.     Comments: She has some tenderness over her right sacral area.  There is no specific tenderness over the sciatic nerve.  No specific tenderness over the rest of the lumbar sacral spine.  No swelling or deformity noted.  No rashes.  Negative straight leg raise on the right.  Pedal pulses are intact.  She has normal sensation and motor function distally.  Patellar reflexes symmetric  Skin:    General: Skin is warm and dry.  Neurological:     Mental Status: She is alert and oriented to person, place, and time.     ED Results / Procedures / Treatments   Labs (all labs  ordered are listed, but only abnormal results are displayed) Labs Reviewed - No data to display  EKG None  Radiology DG Lumbar Spine 2-3 Views  Result Date: 04/06/2021 CLINICAL DATA:  80 year old female with acute onset low back pain radiating to the buttocks when bending. EXAM: LUMBAR SPINE - 2-3 VIEW COMPARISON:  None. FINDINGS: Normal lumbar segmentation.  Osteopenia.  Preserved lumbar lordosis. Compression fractures of T12, L2, L3 and L5, most are moderate. The fractures of L2 and L5 appear most recent by radiography. Additionally, there is a more subtle L4 superior endplate compression also. This is age indeterminate. No significant retropulsion of bone. Moderate superimposed degenerative lumbar facet hypertrophy. Grossly intact visible sacrum and SI joints. Negative abdominal visceral contours. IMPRESSION: Osteopenia with lower thoracic and lumbar compression fractures sparing only the L1 level. Multiple levels are age indeterminate. If specific therapy is desired, Lumbar MRI without contrast or Nuclear Medicine Whole-body Bone Scan would best confirm candidacy for vertebroplasty. Electronically Signed   By: Genevie Ann M.D.   On: 04/06/2021 09:11   DG Sacrum/Coccyx  Result Date: 04/06/2021 CLINICAL DATA:  80 year old female with acute onset low back pain radiating to the buttocks when bending. EXAM: SACRUM AND COCCYX - 2+ VIEW COMPARISON:  Lumbar radiographs today. FINDINGS: Osteopenia. Lumbar spine is detailed separately. The sacrum, SI joints, and coccyx appear within normal limits. Incidental pelvic phleboliths. Other visible pelvis structures appear intact. IMPRESSION: No sacrococcygeal fracture.  See lumbar details reported separately. Electronically Signed   By: Genevie Ann M.D.   On: 04/06/2021 09:12    Procedures Procedures   Medications Ordered in ED Medications - No data to display  ED Course  I have reviewed the triage vital signs and the nursing notes.  Pertinent labs & imaging  results that were available during my care of the patient were reviewed by me and considered in my medical decision making (see chart for details).    MDM Rules/Calculators/A&P                          Patient is a 80 year old female with osteoporosis who presents with pain to her right lower back.  She has pain just to the right of the sacrum.  She has no neurologic deficits.  No suggestions of cauda equina.  No radicular symptoms.  No fevers or other red flags.  She did have x-rays done of her lumbosacral spine and sacral area.  There are some lumbar compression fractures of indeterminate age.  She does not have any specific tenderness over these areas.  However I did recommend that she follow-up with her PCP this week to schedule an MRI to better assess these areas.  She was given a prescription for short course of tramadol and will also use Voltaren gel to the area.  She will follow-up with her doctor tomorrow.  Return precautions were given. Final Clinical Impression(s) / ED Diagnoses Final diagnoses:  Acute right-sided low back pain without sciatica    Rx / DC Orders ED Discharge Orders         Ordered    traMADol (ULTRAM) 50 MG tablet  Every 6 hours PRN        04/06/21 0956           Malvin Johns, MD 04/06/21 4034

## 2021-04-07 ENCOUNTER — Telehealth: Payer: Self-pay | Admitting: Family Medicine

## 2021-04-07 ENCOUNTER — Other Ambulatory Visit: Payer: Self-pay

## 2021-04-07 NOTE — Telephone Encounter (Signed)
Pt went to ED on 04/06/21 for Acute right-sided low back pain without sciatica. She was asked to let you know about this and they suggested an MRI.

## 2021-04-07 NOTE — Patient Instructions (Signed)
Health Maintenance Due  Topic Date Due  . TETANUS/TDAP  Never done    Depression screen [facility] 2/9 10/24/2020  Decreased Interest 0  Down, Depressed, Hopeless 0  PHQ - 2 Score 0

## 2021-04-07 NOTE — Telephone Encounter (Signed)
She has an upcoming appointment on 04/09/21 at 4:00 with Dr C. They did end up taking several x-rays.

## 2021-04-09 ENCOUNTER — Other Ambulatory Visit: Payer: Self-pay

## 2021-04-09 ENCOUNTER — Ambulatory Visit (INDEPENDENT_AMBULATORY_CARE_PROVIDER_SITE_OTHER): Payer: Medicare HMO | Admitting: Family Medicine

## 2021-04-09 ENCOUNTER — Encounter: Payer: Self-pay | Admitting: Family Medicine

## 2021-04-09 VITALS — BP 136/80 | HR 88 | Temp 98.0°F | Ht 64.0 in | Wt 137.0 lb

## 2021-04-09 DIAGNOSIS — M545 Low back pain, unspecified: Secondary | ICD-10-CM | POA: Diagnosis not present

## 2021-04-09 DIAGNOSIS — M1612 Unilateral primary osteoarthritis, left hip: Secondary | ICD-10-CM

## 2021-04-09 DIAGNOSIS — M810 Age-related osteoporosis without current pathological fracture: Secondary | ICD-10-CM | POA: Diagnosis not present

## 2021-04-09 DIAGNOSIS — M79605 Pain in left leg: Secondary | ICD-10-CM

## 2021-04-09 MED ORDER — TRAMADOL HCL 50 MG PO TABS: 50.0000 mg | ORAL_TABLET | Freq: Four times a day (QID) | ORAL | 0 refills | Status: DC | PRN

## 2021-04-09 NOTE — Progress Notes (Signed)
Shelley Snyder is a 80 y.o. female  Chief Complaint  Patient presents with  . Hip Pain    Pt c/o rt side hip pain, worsening over the last year.    HPI: Shelley Snyder is a 80 y.o. female patient seen today to f/u on ER visit ([facility]) on 04/06/21 for acute Rt sided low back pain.  Pain began 2 days prior to her presenting to the ER when she lifted her Rt leg to put on her sock and felt a shooting pain in her Rt low back/buttock. She had xrays done and was told to f/u with PCP to discuss MRI being ordered. She was given Rx for tramadol and voltaren gel.  Xray of lumbar spine showed multiple lower thoracic and lumbar compression fractures (sparing only L1 level), age indeterminate.  Today she states her back is much improved. She is taking tylenol q8 and tramadol q6. She is also using icy hot and heating pad.   EXAM: LUMBAR SPINE - 2-3 VIEW  FINDINGS: Normal lumbar segmentation.  Osteopenia.  Preserved lumbar lordosis.  Compression fractures of T12, L2, L3 and L5, most are moderate. The fractures of L2 and L5 appear most recent by radiography. Additionally, there is a more subtle L4 superior endplate compression also. This is age indeterminate. No significant retropulsion of bone. Moderate superimposed degenerative lumbar facet hypertrophy. Grossly intact visible sacrum and SI joints.  Negative abdominal visceral contours.  IMPRESSION: Osteopenia with lower thoracic and lumbar compression fractures sparing only the L1 level. Multiple levels are age indeterminate. If specific therapy is desired, Lumbar MRI without contrast or Nuclear Medicine Whole-body Bone Scan would best confirm candidacy for vertebroplasty.   Electronically Signed   By: Genevie Ann M.D.   On: 04/06/2021 09:11  EXAM: SACRUM AND COCCYX - 2+ VIEW  COMPARISON:  Lumbar radiographs today.  FINDINGS: Osteopenia. Lumbar spine is detailed separately. The sacrum, SI joints, and coccyx appear within  normal limits. Incidental pelvic phleboliths. Other visible pelvis structures appear intact.  IMPRESSION: No sacrococcygeal fracture.  See lumbar details reported separately.  Electronically Signed   By: Genevie Ann M.D.   On: 04/06/2021 09:12  She is concerned about chronic Lt lower leg pain, worse in the past year. Pain from heel/foot up to her Lt thigh. Describes as a muscle pain, no sharp.  No change in LE edema.  No numbness or tingling. No weakness. Can walk 2 miles, mows lawn.   Past Medical History:  Diagnosis Date  . Allergy     Past Surgical History:  Procedure Laterality Date  . ABDOMINAL HYSTERECTOMY     aprtial  . BREAST SURGERY    . TONSILLECTOMY      Social History   Socioeconomic History  . Marital status: Widowed    Spouse name: Not on file  . Number of children: Not on file  . Years of education: Not on file  . Highest education level: Not on file  Occupational History  . Not on file  Tobacco Use  . Smoking status: Never Smoker  . Smokeless tobacco: Never Used  Vaping Use  . Vaping Use: Never used  Substance and Sexual Activity  . Alcohol use: Yes    Comment: social  . Drug use: No  . Sexual activity: Not Currently  Other Topics Concern  . Not on file  Social History Narrative  . Not on file   Social Determinants of Health   Financial Resource Strain: Not on file  Food  Insecurity: Not on file  Transportation Needs: Not on file  Physical Activity: Not on file  Stress: Not on file  Social Connections: Not on file  Intimate Partner Violence: Not on file    History reviewed. No pertinent family history.   Immunization History  Administered Date(s) Administered  . Influenza-Unspecified 09/06/2020  . PFIZER(Purple Top)SARS-COV-2 Vaccination 01/12/2020, 02/06/2020, 09/25/2020    Outpatient Encounter Medications as of 04/09/2021  Medication Sig  . acetaminophen (TYLENOL) 650 MG CR tablet Take 650 mg by mouth every 8 (eight) hours as  needed for pain. Up to 2 BID.  Marland Kitchen Ascorbic Acid (VITAMIN C) 1000 MG tablet Take 1,000 mg by mouth daily.  . Calcium Carb-Cholecalciferol 600-800 MG-UNIT TABS Take 2 tablets by mouth daily.  . Cholecalciferol (VITAMIN D-3 PO) Take by mouth.  . Melatonin 10 MG TABS Take by mouth.  . Multiple Vitamins-Minerals (MULTIVITAMIN ADULTS 50+) TABS Take by mouth.  . Psyllium (QC NATURAL VEGETABLE) 95 % POWD Take by mouth.  . traMADol (ULTRAM) 50 MG tablet Take 1 tablet (50 mg total) by mouth every 6 (six) hours as needed.  . lactobacillus acidophilus (BACID) TABS tablet Take 2 tablets by mouth 3 (three) times daily.   No facility-administered encounter medications on file as of 04/09/2021.     ROS: Pertinent positives and negatives noted in HPI. Remainder of ROS non-contributory  No Known Allergies  BP 136/80 (BP Location: Left Arm, Patient Position: Sitting, Cuff Size: Normal)   Pulse 88   Temp 98 F (36.7 C) (Oral)   Ht 5\' 4"  (1.626 m)   Wt 137 lb (62.1 kg)   SpO2 98%   BMI 23.52 kg/m   Wt Readings from Last 3 Encounters:  04/09/21 137 lb (62.1 kg)  04/06/21 134 lb (60.8 kg)  10/24/20 134 lb (60.8 kg)   Temp Readings from Last 3 Encounters:  04/09/21 98 F (36.7 C) (Oral)  04/06/21 98 F (36.7 C) (Oral)  10/24/20 98.1 F (36.7 C) (Temporal)   BP Readings from Last 3 Encounters:  04/09/21 136/80  04/06/21 (!) 150/89  10/24/20 124/80   Pulse Readings from Last 3 Encounters:  04/09/21 88  04/06/21 70  10/24/20 68     Physical Exam Constitutional:      General: She is not in acute distress.    Appearance: She is normal weight. She is not ill-appearing.  Musculoskeletal:     Lumbar back: Tenderness present. No bony tenderness. Normal range of motion. Negative right straight leg raise test and negative left straight leg raise test.  Neurological:     Mental Status: She is alert and oriented to person, place, and time.     Coordination: Coordination normal.     Deep Tendon  Reflexes: Reflexes normal.  Psychiatric:        Mood and Affect: Mood normal.        Behavior: Behavior normal.      A/P:  1. Acute right-sided low back pain without sciatica - improving - cont with heat/ice Refill: - traMADol (ULTRAM) 50 MG tablet; Take 1 tablet (50 mg total) by mouth every 6 (six) hours as needed.  Dispense: 15 tablet; Refill: 0 - pt understands no additional refills will be given without appt/re-eval  2. Primary osteoarthritis of left hip - Ambulatory referral to Sports Medicine  3. Musculoskeletal leg pain, left - Ambulatory referral to Sports Medicine  4. Age-related osteoporosis without current pathological fracture - DG Bone Density; Future    This visit occurred during  the SARS-CoV-2 public health emergency.  Safety protocols were in place, including screening questions prior to the visit, additional usage of staff PPE, and extensive cleaning of exam room while observing appropriate contact time as indicated for disinfecting solutions.

## 2021-04-14 NOTE — Progress Notes (Signed)
[facility] [place] Phone: [phone] Subjective:   [name], am serving as a scribe for Dr. Hulan Saas. This visit occurred during the SARS-CoV-2 public health emergency.  Safety protocols were in place, including screening questions prior to the visit, additional usage of staff PPE, and extensive cleaning of exam room while observing appropriate contact time as indicated for disinfecting solutions.   I'm seeing this patient by the request  of:  Cirigliano, [name], DO  CC: left hip pain   [id number]  Shelley Snyder is a 80 y.o. female coming in with complaint of L hip and leg pain. Patient states that after she sits for a while and stands up she will have pain from R heel up into the R hip for a few years. Uses Tylenol for pain. No pain with standing.   One week ago patient pulled muscle in lower back. Went into ED. Was having hard time walking due to pain. Was putting something on her foot and had hip in flexed position and felt pain in R side of lower back.     Patient did have x-rays of the lumbar spine taken in Apr 06, 2021.  Found to have compression fractures of T12, L2, L3, L5 with a questionable L4 compression as well.  Past Medical History:  Diagnosis Date  . Allergy    Past Surgical History:  Procedure Laterality Date  . ABDOMINAL HYSTERECTOMY     aprtial  . BREAST SURGERY    . TONSILLECTOMY     Social History   Socioeconomic History  . Marital status: Widowed    Spouse name: Not on file  . Number of children: Not on file  . Years of education: Not on file  . Highest education level: Not on file  Occupational History  . Not on file  Tobacco Use  . Smoking status: Never Smoker  . Smokeless tobacco: Never Used  Vaping Use  . Vaping Use: Never used  Substance and Sexual Activity  . Alcohol use: Yes    Comment: social  . Drug use: No  . Sexual activity: Not Currently  Other Topics  Concern  . Not on file  Social History Narrative  . Not on file   Social Determinants of Health   Financial Resource Strain: Not on file  Food Insecurity: Not on file  Transportation Needs: Not on file  Physical Activity: Not on file  Stress: Not on file  Social Connections: Not on file   No Known Allergies No family history on file.     Current Outpatient Medications (Analgesics):  .  acetaminophen (TYLENOL) 650 MG CR tablet, Take 650 mg by mouth every 8 (eight) hours as needed for pain. Up to 2 BID. Marland Kitchen  traMADol (ULTRAM) 50 MG tablet, Take 1 tablet (50 mg total) by mouth every 6 (six) hours as needed.   Current Outpatient Medications (Other):  Marland Kitchen  Ascorbic Acid (VITAMIN C) 1000 MG tablet, Take 1,000 mg by mouth daily. .  Calcium Carb-Cholecalciferol 600-800 MG-UNIT TABS, Take 2 tablets by mouth daily. .  Cholecalciferol (VITAMIN D-3 PO), Take by mouth. .  lactobacillus acidophilus (BACID) TABS tablet, Take 2 tablets by mouth 3 (three) times daily. .  Melatonin 10 MG TABS, Take by mouth. .  Multiple Vitamins-Minerals (MULTIVITAMIN ADULTS 50+) TABS, Take by mouth. .  Psyllium (QC NATURAL VEGETABLE) 95 % POWD, Take by mouth.   Reviewed prior external information including notes and  imaging from  primary care provider As well as notes that were available from care everywhere and other healthcare systems.  Past medical history, social, surgical and family history all reviewed in electronic medical record.  No pertanent information unless stated regarding to the chief complaint.   Review of Systems:  No headache, visual changes, nausea, vomiting, diarrhea, constipation, dizziness, abdominal pain, skin rash, fevers, chills, night sweats, weight loss, swollen lymph nodes, body aches, joint swelling, chest pain, shortness of breath, mood changes. POSITIVE muscle aches  Objective  Blood pressure (!) 148/90, pulse 62, height 5\' 4"  (1.626 m), weight 138 lb (62.6 kg), SpO2 99 %.    General: No apparent distress alert and oriented x3 mood and affect normal, dressed appropriately.  HEENT: Pupils equal, extraocular movements intact  Respiratory: Patient's speak in full sentences and does not appear short of breath  Cardiovascular: No lower extremity edema, non tender, no erythema  Gait normal with good balance and coordination.  MSK: Patient's neck exam does have some mild tenderness noted in the spinous process.  Of L4 and L5 but otherwise fairly unremarkable.  Relatively good range of motion.  Does have significant tightness still of the right hip with FABER test.  Good internal range of motion of the hip though noted.  Neurovascularly intact distally.  Good strength of the hip.  Patient does have mild tightness noted of the posterior heel and around the Achilles.  Does lack 5 degrees of dorsiflexion compared to the contralateral side.  Negative Thompson.  No significant swelling of the heel noted.  Good capillary refill.  [id number]; 15 additional minutes spent for Therapeutic exercises as stated in above notes.  This included exercises focusing on stretching, strengthening, with significant focus on eccentric aspects.   Long term goals include an improvement in range of motion, strength, endurance as well as avoiding reinjury. Patient's frequency would include in 1-2 times a day, 3-5 times a week for a duration of 6-12 weeks. Low back exercises that included:  Pelvic tilt/bracing instruction to focus on control of the pelvic girdle and lower abdominal muscles  Glute strengthening exercises, focusing on proper firing of the glutes without engaging the low back muscles Proper stretching techniques for maximum relief for the hamstrings, hip flexors, low back and some rotation where tolerated   Proper technique shown and discussed handout in great detail with ATC.  All questions were discussed and answered.     Impression and Recommendations:     The above documentation has been  reviewed and is accurate and complete [name], DO

## 2021-04-15 ENCOUNTER — Ambulatory Visit (INDEPENDENT_AMBULATORY_CARE_PROVIDER_SITE_OTHER): Payer: Medicare HMO

## 2021-04-15 ENCOUNTER — Encounter: Payer: Self-pay | Admitting: Family Medicine

## 2021-04-15 ENCOUNTER — Ambulatory Visit: Payer: Medicare HMO | Admitting: Family Medicine

## 2021-04-15 ENCOUNTER — Other Ambulatory Visit: Payer: Self-pay

## 2021-04-15 VITALS — BP 148/90 | HR 62 | Ht 64.0 in | Wt 138.0 lb

## 2021-04-15 DIAGNOSIS — M160 Bilateral primary osteoarthritis of hip: Secondary | ICD-10-CM | POA: Diagnosis not present

## 2021-04-15 DIAGNOSIS — M25551 Pain in right hip: Secondary | ICD-10-CM

## 2021-04-15 DIAGNOSIS — M79671 Pain in right foot: Secondary | ICD-10-CM | POA: Diagnosis not present

## 2021-04-15 DIAGNOSIS — M47817 Spondylosis without myelopathy or radiculopathy, lumbosacral region: Secondary | ICD-10-CM | POA: Diagnosis not present

## 2021-04-15 DIAGNOSIS — G8929 Other chronic pain: Secondary | ICD-10-CM

## 2021-04-15 DIAGNOSIS — S32020A Wedge compression fracture of second lumbar vertebra, initial encounter for closed fracture: Secondary | ICD-10-CM

## 2021-04-15 DIAGNOSIS — Z8781 Personal history of (healed) traumatic fracture: Secondary | ICD-10-CM | POA: Insufficient documentation

## 2021-04-15 NOTE — Patient Instructions (Addendum)
Xray today MRI [facility] [phone] Heel lift R  Exercises Pennsaid See me again in 4 weeks

## 2021-04-15 NOTE — Assessment & Plan Note (Signed)
Significant stiffness of the right hip noted.  We will get x-rays to further evaluate.  With patient having the significant number of compression fractures and patient not having pain we will also consider x-rays of the pelvis area to make sure there is no other abnormality that could be contributing.  Home exercises given.  Patient does have the tramadol she is taking somewhat regularly with the Tylenol at the moment.  We discussed the potential for injections but patient wants to try home exercises first.  Follow-up again in 6 weeks and if not better consider formal physical therapy and injections.  Recently has done physical therapy for a prolapse with no significant benefit.

## 2021-04-15 NOTE — Assessment & Plan Note (Signed)
Patient has multiple compression fractures noted of the back.  No history of osteoporosis noted.  Discussed with patient at this time that I do feel that further imaging is warranted.  I do believe that this could be contributing to some of the radicular symptoms patient is somewhat describing.  Patient wants to avoid significant amount of medications otherwise.  Could be a candidate for potential kyphoplasty.  The MRI will help [place] with staging.

## 2021-04-16 NOTE — Addendum Note (Signed)
Addended by: Judy Pimple R on: 04/16/2021 10:52 AM   Modules accepted: Orders

## 2021-04-17 DIAGNOSIS — C44319 Basal cell carcinoma of skin of other parts of face: Secondary | ICD-10-CM | POA: Diagnosis not present

## 2021-04-18 ENCOUNTER — Other Ambulatory Visit (HOSPITAL_BASED_OUTPATIENT_CLINIC_OR_DEPARTMENT_OTHER): Payer: Self-pay | Admitting: Family Medicine

## 2021-04-18 ENCOUNTER — Other Ambulatory Visit: Payer: Self-pay | Admitting: Family Medicine

## 2021-04-18 DIAGNOSIS — M25551 Pain in right hip: Secondary | ICD-10-CM

## 2021-04-18 DIAGNOSIS — Z1231 Encounter for screening mammogram for malignant neoplasm of breast: Secondary | ICD-10-CM

## 2021-04-22 DIAGNOSIS — K623 Rectal prolapse: Secondary | ICD-10-CM | POA: Diagnosis not present

## 2021-04-27 ENCOUNTER — Other Ambulatory Visit: Payer: Self-pay

## 2021-04-27 ENCOUNTER — Ambulatory Visit
Admission: RE | Admit: 2021-04-27 | Discharge: 2021-04-27 | Disposition: A | Discharge status: Home / Self Care | Payer: Medicare HMO | Source: Ambulatory Visit | Attending: Family Medicine | Admitting: Family Medicine

## 2021-04-27 DIAGNOSIS — M25551 Pain in right hip: Secondary | ICD-10-CM

## 2021-04-27 DIAGNOSIS — M48061 Spinal stenosis, lumbar region without neurogenic claudication: Secondary | ICD-10-CM | POA: Diagnosis not present

## 2021-04-27 DIAGNOSIS — M545 Low back pain, unspecified: Secondary | ICD-10-CM | POA: Diagnosis not present

## 2021-04-29 ENCOUNTER — Encounter: Payer: Self-pay | Admitting: Family Medicine

## 2021-05-12 DIAGNOSIS — R195 Other fecal abnormalities: Secondary | ICD-10-CM | POA: Diagnosis not present

## 2021-05-12 DIAGNOSIS — R159 Full incontinence of feces: Secondary | ICD-10-CM | POA: Diagnosis not present

## 2021-05-12 DIAGNOSIS — K623 Rectal prolapse: Secondary | ICD-10-CM | POA: Diagnosis not present

## 2021-05-14 NOTE — Progress Notes (Signed)
[facility] [address] [place] Phone: [phone] Subjective:   I Shelley Snyder am serving as a Education administrator for Dr. Hulan Saas.  This visit occurred during the SARS-CoV-2 public health emergency.  Safety protocols were in place, including screening questions prior to the visit, additional usage of staff PPE, and extensive cleaning of exam room while observing appropriate contact time as indicated for disinfecting solutions.   I'm seeing this patient by the request  of:  Ronnald Nian, DO  CC: Low back and right hip pain  OAC:[id number]   04/15/2021 Significant stiffness of the right hip noted.  We will get x-rays to further evaluate.  With patient having the significant number of compression fractures and patient not having pain we will also consider x-rays of the pelvis area to make sure there is no other abnormality that could be contributing.  Home exercises given.  Patient does have the tramadol she is taking somewhat regularly with the Tylenol at the moment.  We discussed the potential for injections but patient wants to try home exercises first.  Follow-up again in 6 weeks and if not better consider formal physical therapy and injections.  Recently has done physical therapy for a prolapse with no significant benefit.  Patient has multiple compression fractures noted of the back.  No history of osteoporosis noted.  Discussed with patient at this time that I do feel that further imaging is warranted.  I do believe that this could be contributing to some of the radicular symptoms patient is somewhat describing.  Patient wants to avoid significant amount of medications otherwise.  Could be a candidate for potential kyphoplasty.  The MRI will help [place] with staging.  Update 05/15/2021 Shelley Snyder is a 80 y.o. female coming in with complaint of low back pain. Patient states her hip is doing better. Has been exercising. States the back has no  pain.  Patient states that the hip pain is completely resolved at this moment as well.  Has been active and able to even walk 2-1/2 to 3 miles a day  Lumbar MRI 04/27/2021 IMPRESSION: 1. Multiple lower thoracic and lumbar compression fractures, as detailed above. The L4 fracture appears acute/recent with bone marrow edema that involves the superior endplate and anterior pedicles bilaterally. Mild edema in the adjacent L3-L4 and L4-L5 discs is likely posttraumatic. There is mild bony retropulsion along the superior L4 and inferior L3 endplates with mild resulting canal stenosis at these levels. 2. Superimposed multilevel degenerative change, detailed above and greatest at L5-S1 where there is moderate right foraminal stenosis and mild right subarticular recess stenosis. Disc contacts the exiting right L5 and descending right S1 nerves.     Past Medical History:  Diagnosis Date   Allergy    Past Surgical History:  Procedure Laterality Date   ABDOMINAL HYSTERECTOMY     aprtial   BREAST SURGERY     TONSILLECTOMY     Social History   Socioeconomic History   Marital status: Widowed    Spouse name: Not on file   Number of children: Not on file   Years of education: Not on file   Highest education level: Not on file  Occupational History   Not on file  Tobacco Use   Smoking status: Never   Smokeless tobacco: Never  Vaping Use   Vaping Use: Never used  Substance and Sexual Activity   Alcohol use: Yes    Comment: social   Drug use:  No   Sexual activity: Not Currently  Other Topics Concern   Not on file  Social History Narrative   Not on file   Social Determinants of Health   Financial Resource Strain: Not on file  Food Insecurity: Not on file  Transportation Needs: Not on file  Physical Activity: Not on file  Stress: Not on file  Social Connections: Not on file   No Known Allergies No family history on file.     Current Outpatient Medications (Analgesics):     acetaminophen (TYLENOL) 650 MG CR tablet, Take 650 mg by mouth every 8 (eight) hours as needed for pain. Up to 2 BID.   traMADol (ULTRAM) 50 MG tablet, Take 1 tablet (50 mg total) by mouth every 6 (six) hours as needed.   Current Outpatient Medications (Other):    Ascorbic Acid (VITAMIN C) 1000 MG tablet, Take 1,000 mg by mouth daily.   Calcium Carb-Cholecalciferol 600-800 MG-UNIT TABS, Take 2 tablets by mouth daily.   Cholecalciferol (VITAMIN D-3 PO), Take by mouth.   lactobacillus acidophilus (BACID) TABS tablet, Take 2 tablets by mouth 3 (three) times daily.   Melatonin 10 MG TABS, Take by mouth.   Multiple Vitamins-Minerals (MULTIVITAMIN ADULTS 50+) TABS, Take by mouth.   Psyllium (QC NATURAL VEGETABLE) 95 % POWD, Take by mouth.   Reviewed prior external information including notes and imaging from  primary care provider As well as notes that were available from care everywhere and other healthcare systems.  Past medical history, social, surgical and family history all reviewed in electronic medical record.  No pertanent information unless stated regarding to the chief complaint.   Review of Systems:  No headache, visual changes, nausea, vomiting, diarrhea, constipation, dizziness, abdominal pain, skin rash, fevers, chills, night sweats, weight loss, swollen lymph nodes, body aches, joint swelling, chest pain, shortness of breath, mood changes. P  Objective  Blood pressure 120/82, pulse 76, height 5\' 4"  (1.626 m), weight 134 lb (60.8 kg), SpO2 98 %.   General: No apparent distress alert and oriented x3 mood and affect normal, dressed appropriately.  HEENT: Pupils equal, extraocular movements intact  Respiratory: Patient's speak in full sentences and does not appear short of breath  Cardiovascular: No lower extremity edema, non tender, no erythema  Gait normal with good balance and coordination.  MSK: Patient is fairly comfortable sitting in the chair.  Nontender on exam with very  mild light palpation in the lumbar spine.  Patient's right hip no significant discomfort over the lateral aspect.    Impression and Recommendations:     The above documentation has been reviewed and is accurate and complete [name], DO

## 2021-05-15 ENCOUNTER — Other Ambulatory Visit: Payer: Self-pay

## 2021-05-15 ENCOUNTER — Ambulatory Visit: Payer: Medicare HMO | Admitting: Family Medicine

## 2021-05-15 ENCOUNTER — Encounter: Payer: Self-pay | Admitting: Family Medicine

## 2021-05-15 DIAGNOSIS — S32020A Wedge compression fracture of second lumbar vertebra, initial encounter for closed fracture: Secondary | ICD-10-CM

## 2021-05-15 DIAGNOSIS — M810 Age-related osteoporosis without current pathological fracture: Secondary | ICD-10-CM | POA: Diagnosis not present

## 2021-05-15 NOTE — Assessment & Plan Note (Signed)
Patient is scheduled for her bone density at the beginning of July.

## 2021-05-15 NOTE — Patient Instructions (Addendum)
Good to see you Glad you are feeling better Have fun on the cruise Send me a message when you have the bone density See me again when you need me

## 2021-05-15 NOTE — Assessment & Plan Note (Addendum)
Patient was feeling significantly better at this time.  An MRI did show the patient did have an acute fracture of the L4.  Does have a history of osteoporosis and is undergoing a bone density scan in July.  I believe the patient will likely need some type of treatment.  Patient at the moment though is feeling significantly better at the moment and would not want anything changes.  Patient has been able to walk 2-1/2 to 3 miles daily with no significant discomfort. Discussed with patient if she would like anything else further for work-up.  Patient would like to wait the bone density.  Not take anything for pain.  Happy with results.  Patient can follow-up with me then as an as-needed basis.

## 2021-05-20 ENCOUNTER — Ambulatory Visit (INDEPENDENT_AMBULATORY_CARE_PROVIDER_SITE_OTHER): Payer: Medicare HMO | Admitting: *Deleted

## 2021-05-20 DIAGNOSIS — Z0000 Encounter for general adult medical examination without abnormal findings: Secondary | ICD-10-CM | POA: Diagnosis not present

## 2021-05-20 DIAGNOSIS — Z03818 Encounter for observation for suspected exposure to other biological agents ruled out: Secondary | ICD-10-CM | POA: Diagnosis not present

## 2021-05-20 NOTE — Progress Notes (Addendum)
Subjective:   Shelley Snyder is a 80 y.o. female who presents for Medicare Annual (Subsequent) preventive examination.  I connected with  Shelley Snyder on 05/20/21 by a video enable video telemedicine application and verified that I am speaking with the correct person using two identifiers.   I discussed the limitations of evaluation and management by telemedicine. The patient expressed understanding and agreed to proceed.  Patient location: home  Provider location: video  I provided 30 minutes of non face - to - face time during this encounter.   Review of Systems    NA Cardiac Risk Factors include: advanced age (>70men, >29 women);hypertension;dyslipidemia     Objective:    Today's Vitals   There is no height or weight on file to calculate BMI.  Advanced Directives 05/20/2021 04/06/2021 09/28/2017  Does Patient Have a Medical Advance Directive? Yes Yes Yes  Type of Paramedic of [place];Living will - [place];Living will  Does patient want to make changes to medical advance directive? Yes (ED - Information included in AVS) - -  Copy of [place] in Chart? No - copy requested - -    Current Medications (verified) Outpatient Encounter Medications as of 05/20/2021  Medication Sig   acetaminophen (TYLENOL) 650 MG CR tablet Take 650 mg by mouth every 8 (eight) hours as needed for pain. Up to 2 BID.   Ascorbic Acid (VITAMIN C) 1000 MG tablet Take 1,000 mg by mouth daily.   Calcium Carb-Cholecalciferol 600-800 MG-UNIT TABS Take 2 tablets by mouth daily.   Cholecalciferol (VITAMIN D-3 PO) Take by mouth.   lactobacillus acidophilus (BACID) TABS tablet Take 2 tablets by mouth 3 (three) times daily.   Melatonin 10 MG TABS Take by mouth.   Multiple Vitamins-Minerals (MULTIVITAMIN ADULTS 50+) TABS Take by mouth.   Psyllium (QC NATURAL VEGETABLE) 95 % POWD Take by mouth. (Patient not taking: Reported on 05/20/2021)   traMADol  (ULTRAM) 50 MG tablet Take 1 tablet (50 mg total) by mouth every 6 (six) hours as needed.   No facility-administered encounter medications on file as of 05/20/2021.    Allergies (verified) Patient has no known allergies.   History: Past Medical History:  Diagnosis Date   Allergy    Past Surgical History:  Procedure Laterality Date   ABDOMINAL HYSTERECTOMY     aprtial   BREAST SURGERY     TONSILLECTOMY     History reviewed. No pertinent family history. Social History   Socioeconomic History   Marital status: Widowed    Spouse name: Not on file   Number of children: Not on file   Years of education: Not on file   Highest education level: Not on file  Occupational History   Not on file  Tobacco Use   Smoking status: Never   Smokeless tobacco: Never  Vaping Use   Vaping Use: Never used  Substance and Sexual Activity   Alcohol use: Yes    Comment: social   Drug use: No   Sexual activity: Not Currently  Other Topics Concern   Not on file  Social History Narrative   Not on file   Social Determinants of Health   Financial Resource Strain: Low Risk    Difficulty of Paying Living Expenses: Not hard at all  Food Insecurity: No Food Insecurity   Worried About [place] in the Last Year: Never true   [place] in the Last Year: Never true  Transportation Needs: No Data processing manager (Medical): No   Lack of Transportation (Non-Medical): No  Physical Activity: Insufficiently Active   Days of Exercise per Week: 3 days   Minutes of Exercise per Session: 20 min  Stress: No Stress Concern Present   Feeling of Stress : Not at all  Social Connections: Moderately Isolated   Frequency of Communication with Friends and Family: Three times a week   Frequency of Social Gatherings with Friends and Family: Three times a week   Attends Religious Services: 1 to 4 times per year   Active Member of Clubs or Organizations: No   Attends English as a second language teacher Meetings: Never   Marital Status: Widowed    Tobacco Counseling Counseling given: Not Answered   Clinical Intake:  Pre-visit preparation completed: Yes  Pain : No/denies pain     Nutritional Risks: None Diabetes: No  How often do you need to have someone help you when you read instructions, pamphlets, or other written materials from your doctor or pharmacy?: 1 - Never  Diabetic?no     Information entered by :: Leroy Kennedy LPN   Activities of Daily Living In your present state of health, do you have any difficulty performing the following activities: 05/20/2021  Hearing? N  Vision? N  Difficulty concentrating or making decisions? N  Walking or climbing stairs? N  Dressing or bathing? N  Doing errands, shopping? N  Preparing Food and eating ? N  Using the Toilet? N  In the past six months, have you accidently leaked urine? N  Do you have problems with loss of bowel control? N  Managing your Medications? N  Managing your Finances? N  Housekeeping or managing your Housekeeping? N  Some recent data might be hidden    Patient Care Team: Ronnald Nian, DO as PCP - General (Family Medicine)  Indicate any recent Medical Services you may have received from other than [facility] providers in the past year (date may be approximate).     Assessment:   This is a routine wellness examination for Shelley Snyder.  Hearing/Vision screen Hearing Screening - Comments:: No trouble hearing Vision Screening - Comments:: [facility]  Dietary issues and exercise activities discussed: Current Exercise Habits: Home exercise routine, Type of exercise: walking;strength training/weights, Time (Minutes): 25, Frequency (Times/Week): 4, Weekly Exercise (Minutes/Week): 100, Intensity: Moderate   Goals Addressed             This Visit's Progress    Patient Stated       Continue current lifestyle staying active        Depression Screen PHQ 2/9 Scores  05/20/2021 10/24/2020  PHQ - 2 Score 0 0    Fall Risk Fall Risk  05/20/2021 10/24/2020  Falls in the past year? 0 0  Number falls in past yr: 0 0  Injury with Fall? 0 0  Follow up Falls evaluation completed;Falls prevention discussed -    FALL RISK PREVENTION PERTAINING TO THE HOME:  Any stairs in or around the home? No  If so, are there any without handrails? No  Home free of loose throw rugs in walkways, pet beds, electrical cords, etc? Yes  Adequate lighting in your home to reduce risk of falls? Yes   ASSISTIVE DEVICES UTILIZED TO PREVENT FALLS:  Life alert? No  Use of a cane, walker or w/c? No  Grab bars in the bathroom? Yes  Shower chair or bench in shower? No  Elevated  toilet seat or a handicapped toilet? No   TIMED UP AND GO:  Was the test performed? No .   Tele health    Cognitive Function:   Normal cognitive status assessed by direct observation by this Nurse Health Advisor. No abnormalities found.        Immunizations Immunization History  Administered Date(s) Administered   Influenza-Unspecified 09/06/2020   PFIZER(Purple Top)SARS-COV-2 Vaccination 01/12/2020, 02/06/2020, 09/25/2020    TDAP status: Due, Education has been provided regarding the importance of this vaccine. Advised may receive this vaccine at local pharmacy or Health Dept. Aware to provide a copy of the vaccination record if obtained from local pharmacy or Health Dept. Verbalized acceptance and understanding.  Flu Vaccine status: Up to date  Pneumococcal vaccine status: Up to date per patient   Covid-19 vaccine status: Completed vaccines  Qualifies for Shingles Vaccine? No   Zostavax completed No   Shingrix Completed?: Yes  per patient  Screening Tests Health Maintenance  Topic Date Due   TETANUS/TDAP  Never done   Zoster Vaccines- Shingrix (1 of 2) Never done   COVID-19 Vaccine (4 - Booster for Pfizer series) 12/26/2020   INFLUENZA VACCINE  07/07/2021   DEXA SCAN  Completed    PNA vac Low Risk Adult  Completed   HPV VACCINES  Aged Out    Health Maintenance  Health Maintenance Due  Topic Date Due   TETANUS/TDAP  Never done   Zoster Vaccines- Shingrix (1 of 2) Never done   COVID-19 Vaccine (4 - Booster for Pfizer series) 12/26/2020    Colorectal cancer screening: No longer required.   Mammogram status: Completed sled for 06-10-2021. Repeat every year  Bone Density scheduled for 06-10-2021  Lung Cancer Screening: (Low Dose CT Chest recommended if Age 66-80 years, 30 pack-year currently smoking OR have quit w/in 15years.) does not qualify.   Lung Cancer Screening Referral: NA  Additional Screening:  Hepatitis C Screening: does qualify  Vision Screening: Recommended annual ophthalmology exams for early detection of glaucoma and other disorders of the eye. Is the patient up to date with their annual eye exam?  No  Who is the provider or what is the name of the office in which the patient attends annual eye exams? [facility] If pt is not established with a provider, would they like to be referred to a provider to establish care?  established .   Dental Screening: Recommended annual dental exams for proper oral hygiene  Community Resource Referral / Chronic Care Management: CRR required this visit?  No   CCM required this visit?  No      Plan:     I have personally reviewed and noted the following in the patient's chart:   Medical and social history Use of alcohol, tobacco or illicit drugs  Current medications and supplements including opioid prescriptions.  Functional ability and status Nutritional status Physical activity Advanced directives List of other physicians Hospitalizations, surgeries, and ER visits in previous 12 months Vitals Screenings to include cognitive, depression, and falls Referrals and appointments  In addition, I have reviewed and discussed with patient certain preventive protocols, quality metrics, and best practice  recommendations. A written personalized care plan for preventive services as well as general preventive health recommendations were provided to patient.     Leroy Kennedy, LPN   01/30/8249   Nurse Notes: na

## 2021-05-20 NOTE — Patient Instructions (Addendum)
[name] , Thank you for taking time to come for your Medicare Wellness Visit. I appreciate your ongoing commitment to your health goals. Please review the following plan we discussed and let me know if I can assist you in the future.   Screening recommendations/referrals: Colonoscopy: no longer required Mammogram: scheduled 06-10-2021 Bone Density: scheduled 06-10-2021 Recommended yearly ophthalmology/optometry visit for glaucoma screening and checkup Recommended yearly dental visit for hygiene and checkup  Vaccinations: Influenza vaccine: up to date Pneumococcal vaccine: up to date per patient Tdap vaccine: Due - education provided Shingles vaccine: up to date per patient    Advanced directives:  yes copy requested  Conditions/risks identified: na  Next appointment: 11-30 @9 :00 Shelley Snyder   Preventive Care 40 Years and Older, Female Preventive care refers to lifestyle choices and visits with your health care provider that can promote health and wellness. What does preventive care include? A yearly physical exam. This is also called an annual well check. Dental exams once or twice a year. Routine eye exams. Ask your health care provider how often you should have your eyes checked. Personal lifestyle choices, including: Daily care of your teeth and gums. Regular physical activity. Eating a healthy diet. Avoiding tobacco and drug use. Limiting alcohol use. Practicing safe sex. Taking low-dose aspirin every day. Taking vitamin and mineral supplements as recommended by your health care provider. What happens during an annual well check? The services and screenings done by your health care provider during your annual well check will depend on your age, overall health, lifestyle risk factors, and family history of disease. Counseling  Your health care provider may ask you questions about your: Alcohol use. Tobacco use. Drug use. Emotional well-being. Home and relationship  well-being. Sexual activity. Eating habits. History of falls. Memory and ability to understand (cognition). Work and work Statistician. Reproductive health. Screening  You may have the following tests or measurements: Height, weight, and BMI. Blood pressure. Lipid and cholesterol levels. These may be checked every 5 years, or more frequently if you are over 38 years old. Skin check. Lung cancer screening. You may have this screening every year starting at age 32 if you have a 30-pack-year history of smoking and currently smoke or have quit within the past 15 years. Fecal occult blood test (FOBT) of the stool. You may have this test every year starting at age 41. Flexible sigmoidoscopy or colonoscopy. You may have a sigmoidoscopy every 5 years or a colonoscopy every 10 years starting at age 42. Hepatitis C blood test. Hepatitis B blood test. Sexually transmitted disease (STD) testing. Diabetes screening. This is done by checking your blood sugar (glucose) after you have not eaten for a while (fasting). You may have this done every 1-3 years. Bone density scan. This is done to screen for osteoporosis. You may have this done starting at age 25. Mammogram. This may be done every 1-2 years. Talk to your health care provider about how often you should have regular mammograms. Talk with your health care provider about your test results, treatment options, and if necessary, the need for more tests. Vaccines  Your health care provider may recommend certain vaccines, such as: Influenza vaccine. This is recommended every year. Tetanus, diphtheria, and acellular pertussis (Tdap, Td) vaccine. You may need a Td booster every 10 years. Zoster vaccine. You may need this after age 76. Pneumococcal 13-valent conjugate (PCV13) vaccine. One dose is recommended after age 7. Pneumococcal polysaccharide (PPSV23) vaccine. One dose is recommended after age 36. Talk  to your health care provider about which  screenings and vaccines you need and how often you need them. This information is not intended to replace advice given to you by your health care provider. Make sure you discuss any questions you have with your health care provider. Document Released: 12/20/2015 Document Revised: 08/12/2016 Document Reviewed: 09/24/2015 Elsevier Interactive Patient Education  2017 [place] Prevention in the Home Falls can cause injuries. They can happen to people of all ages. There are many things you can do to make your home safe and to help prevent falls. What can I do on the outside of my home? Regularly fix the edges of walkways and driveways and fix any cracks. Remove anything that might make you trip as you walk through a door, such as a raised step or threshold. Trim any bushes or trees on the path to your home. Use bright outdoor lighting. Clear any walking paths of anything that might make someone trip, such as rocks or tools. Regularly check to see if handrails are loose or broken. Make sure that both sides of any steps have handrails. Any raised decks and porches should have guardrails on the edges. Have any leaves, snow, or ice cleared regularly. Use sand or salt on walking paths during winter. Clean up any spills in your garage right away. This includes oil or grease spills. What can I do in the bathroom? Use night lights. Install grab bars by the toilet and in the tub and shower. Do not use towel bars as grab bars. Use non-skid mats or decals in the tub or shower. If you need to sit down in the shower, use a plastic, non-slip stool. Keep the floor dry. Clean up any water that spills on the floor as soon as it happens. Remove soap buildup in the tub or shower regularly. Attach bath mats securely with double-sided non-slip rug tape. Do not have throw rugs and other things on the floor that can make you trip. What can I do in the bedroom? Use night lights. Make sure that you have a  light by your bed that is easy to reach. Do not use any sheets or blankets that are too big for your bed. They should not hang down onto the floor. Have a firm chair that has side arms. You can use this for support while you get dressed. Do not have throw rugs and other things on the floor that can make you trip. What can I do in the kitchen? Clean up any spills right away. Avoid walking on wet floors. Keep items that you use a lot in easy-to-reach places. If you need to reach something above you, use a strong step stool that has a grab bar. Keep electrical cords out of the way. Do not use floor polish or wax that makes floors slippery. If you must use wax, use non-skid floor wax. Do not have throw rugs and other things on the floor that can make you trip. What can I do with my stairs? Do not leave any items on the stairs. Make sure that there are handrails on both sides of the stairs and use them. Fix handrails that are broken or loose. Make sure that handrails are as long as the stairways. Check any carpeting to make sure that it is firmly attached to the stairs. Fix any carpet that is loose or worn. Avoid having throw rugs at the top or bottom of the stairs. If you do have throw rugs,  attach them to the floor with carpet tape. Make sure that you have a light switch at the top of the stairs and the bottom of the stairs. If you do not have them, ask someone to add them for you. What else can I do to help prevent falls? Wear shoes that: Do not have high heels. Have rubber bottoms. Are comfortable and fit you well. Are closed at the toe. Do not wear sandals. If you use a stepladder: Make sure that it is fully opened. Do not climb a closed stepladder. Make sure that both sides of the stepladder are locked into place. Ask someone to hold it for you, if possible. Clearly mark and make sure that you can see: Any grab bars or handrails. First and last steps. Where the edge of each step  is. Use tools that help you move around (mobility aids) if they are needed. These include: Canes. Walkers. Scooters. Crutches. Turn on the lights when you go into a dark area. Replace any light bulbs as soon as they burn out. Set up your furniture so you have a clear path. Avoid moving your furniture around. If any of your floors are uneven, fix them. If there are any pets around you, be aware of where they are. Review your medicines with your doctor. Some medicines can make you feel dizzy. This can increase your chance of falling. Ask your doctor what other things that you can do to help prevent falls. This information is not intended to replace advice given to you by your health care provider. Make sure you discuss any questions you have with your health care provider. Document Released: 09/19/2009 Document Revised: 04/30/2016 Document Reviewed: 12/28/2014 Elsevier Interactive Patient Education  2017 [employer].

## 2021-06-10 ENCOUNTER — Ambulatory Visit (HOSPITAL_BASED_OUTPATIENT_CLINIC_OR_DEPARTMENT_OTHER)
Admission: RE | Admit: 2021-06-10 | Discharge: 2021-06-10 | Disposition: A | Discharge status: Home / Self Care | Payer: Medicare HMO | Source: Ambulatory Visit | Attending: Family Medicine | Admitting: Family Medicine

## 2021-06-10 ENCOUNTER — Encounter: Payer: Self-pay | Admitting: Family Medicine

## 2021-06-10 ENCOUNTER — Other Ambulatory Visit: Payer: Self-pay

## 2021-06-10 ENCOUNTER — Encounter (HOSPITAL_BASED_OUTPATIENT_CLINIC_OR_DEPARTMENT_OTHER): Payer: Self-pay

## 2021-06-10 DIAGNOSIS — Z780 Asymptomatic menopausal state: Secondary | ICD-10-CM | POA: Diagnosis not present

## 2021-06-10 DIAGNOSIS — M810 Age-related osteoporosis without current pathological fracture: Secondary | ICD-10-CM

## 2021-06-10 DIAGNOSIS — M8589 Other specified disorders of bone density and structure, multiple sites: Secondary | ICD-10-CM | POA: Diagnosis not present

## 2021-06-10 DIAGNOSIS — Z1231 Encounter for screening mammogram for malignant neoplasm of breast: Secondary | ICD-10-CM | POA: Insufficient documentation

## 2021-06-12 ENCOUNTER — Encounter: Payer: Self-pay | Admitting: Family Medicine

## 2021-06-16 DIAGNOSIS — K621 Rectal polyp: Secondary | ICD-10-CM | POA: Diagnosis not present

## 2021-06-16 DIAGNOSIS — K623 Rectal prolapse: Secondary | ICD-10-CM | POA: Diagnosis not present

## 2021-06-20 DIAGNOSIS — H26493 Other secondary cataract, bilateral: Secondary | ICD-10-CM | POA: Diagnosis not present

## 2021-06-20 DIAGNOSIS — H353190 Nonexudative age-related macular degeneration, unspecified eye, stage unspecified: Secondary | ICD-10-CM | POA: Diagnosis not present

## 2021-06-20 DIAGNOSIS — Z0100 Encounter for examination of eyes and vision without abnormal findings: Secondary | ICD-10-CM | POA: Diagnosis not present

## 2021-06-20 DIAGNOSIS — H35033 Hypertensive retinopathy, bilateral: Secondary | ICD-10-CM | POA: Diagnosis not present

## 2021-06-20 DIAGNOSIS — H524 Presbyopia: Secondary | ICD-10-CM | POA: Diagnosis not present

## 2021-07-04 ENCOUNTER — Encounter: Payer: Self-pay | Admitting: Family Medicine

## 2021-07-14 NOTE — Telephone Encounter (Signed)
Pt called today saying her insurance still has not received the request. Can we resend?

## 2021-07-22 DIAGNOSIS — K623 Rectal prolapse: Secondary | ICD-10-CM | POA: Diagnosis not present

## 2021-07-24 NOTE — Telephone Encounter (Signed)
Issue escalated to appropriate party who handles Prolia requests. Sw, cma

## 2021-07-29 NOTE — Telephone Encounter (Signed)
[facility] @ [phone], spoke to [place],  [place] for Approval was approved from 07/29/21 - 07/29/22 (2 visits).  Dm/cma

## 2021-08-06 ENCOUNTER — Ambulatory Visit (INDEPENDENT_AMBULATORY_CARE_PROVIDER_SITE_OTHER): Payer: Medicare HMO

## 2021-08-06 ENCOUNTER — Other Ambulatory Visit: Payer: Self-pay

## 2021-08-06 DIAGNOSIS — M1612 Unilateral primary osteoarthritis, left hip: Secondary | ICD-10-CM

## 2021-08-06 DIAGNOSIS — M810 Age-related osteoporosis without current pathological fracture: Secondary | ICD-10-CM

## 2021-08-06 MED ORDER — DENOSUMAB 60 MG/ML ~~LOC~~ SOSY
60.0000 mg | PREFILLED_SYRINGE | Freq: Once | SUBCUTANEOUS | Status: AC
  Administered 2021-08-06: 60 mg via SUBCUTANEOUS

## 2021-08-06 NOTE — Progress Notes (Addendum)
After obtaining consent, and per orders of Dr. Bryan Lemma and Dr. Gena Fray, injection of Prolia given by Lynda Rainwater. Patient instructed to remain in clinic for 20 minutes afterwards, and to report any adverse reaction to me immediately.

## 2021-10-13 DIAGNOSIS — R143 Flatulence: Secondary | ICD-10-CM | POA: Diagnosis not present

## 2021-10-27 ENCOUNTER — Encounter: Payer: Self-pay | Admitting: Family Medicine

## 2021-11-04 ENCOUNTER — Other Ambulatory Visit: Payer: Self-pay

## 2021-11-05 ENCOUNTER — Ambulatory Visit (INDEPENDENT_AMBULATORY_CARE_PROVIDER_SITE_OTHER): Payer: Medicare HMO | Admitting: Family Medicine

## 2021-11-05 ENCOUNTER — Encounter: Payer: Self-pay | Admitting: Family Medicine

## 2021-11-05 ENCOUNTER — Encounter: Payer: Medicare HMO | Admitting: Family Medicine

## 2021-11-05 VITALS — BP 132/82 | HR 79 | Temp 96.3°F | Ht 64.0 in | Wt 134.2 lb

## 2021-11-05 DIAGNOSIS — M810 Age-related osteoporosis without current pathological fracture: Secondary | ICD-10-CM | POA: Diagnosis not present

## 2021-11-05 DIAGNOSIS — D509 Iron deficiency anemia, unspecified: Secondary | ICD-10-CM | POA: Diagnosis not present

## 2021-11-05 DIAGNOSIS — J309 Allergic rhinitis, unspecified: Secondary | ICD-10-CM

## 2021-11-05 DIAGNOSIS — E559 Vitamin D deficiency, unspecified: Secondary | ICD-10-CM | POA: Diagnosis not present

## 2021-11-05 DIAGNOSIS — K5909 Other constipation: Secondary | ICD-10-CM | POA: Insufficient documentation

## 2021-11-05 LAB — CBC
HCT: 36.8 % (ref 36.0–46.0)
Hemoglobin: 12.2 g/dL (ref 12.0–15.0)
MCHC: 33.2 g/dL (ref 30.0–36.0)
MCV: 94.9 fl (ref 78.0–100.0)
Platelets: 191 10*3/uL (ref 150.0–400.0)
RBC: 3.88 Mil/uL (ref 3.87–5.11)
RDW: 14 % (ref 11.5–15.5)
WBC: 3.8 10*3/uL — ABNORMAL LOW (ref 4.0–10.5)

## 2021-11-05 LAB — VITAMIN D 25 HYDROXY (VIT D DEFICIENCY, FRACTURES): VITD: 89.93 ng/mL (ref 30.00–100.00)

## 2021-11-05 MED ORDER — FLUTICASONE PROPIONATE 50 MCG/ACT NA SUSP: 2.0000 | Freq: Every day | NASAL | 6 refills | Status: DC

## 2021-11-05 NOTE — Progress Notes (Signed)
[facility] [address] [place] [address] Dept: [phone] Dept Fax: [phone]  Transfer of Care Office Visit  Subjective:    Patient ID: Shelley Snyder, female    DOB: 1941-07-27, 80 y.o..   MRN: [id number]  Chief Complaint  Patient presents with   Establish Care    [facility]-  establish care. C/o sinus drainage and allergies     History of Present Illness:  Patient is in today to establish care. Ms. Persing was born in [place], [place]. 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 Although she lived in various locations, she has been in [place] for about 42 years. She is a widow (husband died in Mar 06, 2005) and is currently single. She has two children 03/06/58, 32) who currently live in [place]. She lives in a single level townhouse. 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 She is active with walking and doing Pilates twice a week. She denies tobacco or drug use. She does drink a glass of wine with dinner each evening.  Ms. Zhang has a history of osteoporosis. She had a DXA scan this summer. She is currently managed on Prolia and takes daily calcium and Vit D supplements. She denies  osteoporotic fractures. She denies any falls.  Ms. Gordin has an issue with chronic constipation. She uses fiber and a probiotic for regulating her bowels. She has had a past issues with microscopic colitis. This was associated with some iron deficiency anemia.  Ms. Eberly notes that she has been having an issue with a chronic throat clearing due to mucous. She also has episodic periods of rhinorrhea and occasional tearing, esp. from the left eye. She has used OTC Claritin in the past, but did not feel this helped her symptoms.  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 Up to 2 BID.     Ascorbic Acid (VITAMIN C) 1000 MG tablet Take 1,000 mg by mouth daily.     Calcium Carb-Cholecalciferol 600-800 MG-UNIT TABS Take 2 tablets by mouth daily.     Cholecalciferol (VITAMIN D-3 PO) Take by mouth.     lactobacillus acidophilus (BACID) TABS tablet Take 2 tablets by mouth 3 (three) times daily.     Melatonin 10 MG TABS Take by  mouth.     Multiple Vitamins-Minerals (MULTIVITAMIN ADULTS 50+) TABS Take by mouth.     Psyllium (QC NATURAL VEGETABLE) 95 % POWD Take by mouth.     traMADol (ULTRAM) 50 MG tablet Take 1 tablet (50 mg total) by mouth every 6 (six) hours as needed. 15 tablet 0   No facility-administered medications prior to visit.   No Known Allergies    Objective:   Today's Vitals   11/05/21 0957  BP: 132/82  Pulse: 79  Temp: (!) 96.3 F (35.7 C)  SpO2: 97%  Weight: 134 lb 3.2 oz (60.9 kg)  Height: 5\' 4"  (1.626 m)   Body mass index is 23.04 kg/m.   General: Well developed, well nourished. No acute  distress. HEENT: Normocephalic, non-traumatic. PERRL, EOMI. Conjunctiva clear. External ears   normal. EAC and TMs normal bilaterally. Nasal mucosa mildly swollen with pale   turbinates and mild clear rhinorrhea. Mucous membranes moist. Moderate mucous   streaking of the posterior oropharynx. Good dentition. Neck: Supple. No lymphadenopathy. No thyromegaly. Lungs: Clear to auscultation bilaterally. No wheezing, rales or rhonchi. Psych: Alert and oriented. Normal mood and affect.  Health Maintenance Due  Topic Date Due   TETANUS/TDAP  01/30/2020   COVID-19 Vaccine (4 - Booster for Pfizer series) 11/20/2020   Lab Results Last CBC Lab Results  Component Value Date   WBC 3.8 (L) 10/24/2020   HGB 13.0 10/24/2020   HCT 39.6 10/24/2020   MCV 94.8 10/24/2020   RDW 14.3 10/24/2020   PLT 194.0 15/40/0867   Last metabolic panel Lab Results  Component Value Date   GLUCOSE 85 10/24/2020   NA 141 10/24/2020   K 4.2 10/24/2020   CL 106 10/24/2020   CO2 29 10/24/2020   BUN 20 10/24/2020   CREATININE 0.84 10/24/2020   CALCIUM 9.0 10/24/2020   PROT 6.7 10/24/2020   ALBUMIN 4.0 10/24/2020   BILITOT 0.6 10/24/2020   ALKPHOS 60 10/24/2020   AST 20 10/24/2020   ALT 15 10/24/2020   Last lipids Lab Results  Component Value Date   CHOL 211 (H) 10/24/2020   HDL 89.10 10/24/2020   LDLCALC 112 (H) 10/24/2020   TRIG 51.0 10/24/2020   CHOLHDL 2 10/24/2020   Last vitamin D Lab Results  Component Value Date   VD25OH 84.14 10/24/2020     Imaging: DXA Scan (06/10/2021) ASSESSMENT: The BMD measured at AP Spine L1-L2 is 0.875 g/cm2 with a T-score of -2.4. This patient is considered osteopenic according to [facility]) criteria. L-3 & 4 was excluded due to degenerative changes. The scan quality is good.   Site Region Measured Date Measured Age WHO YA BMD Classification T-score AP Spine L1-L2 06/10/2021 80.0 Osteopenia -2.4 0.875 g/cm2   DualFemur Neck Left 06/10/2021 81.0  years Osteopenia -2.4 0.702 g/cm2   Left Forearm Radius 33% 06/10/2021 80.0 Osteopenia -2.1 0.688 g/cm2  MR of Lumbar Spine (04/27/2021) IMPRESSION: 1. Multiple lower thoracic and lumbar compression fractures, as detailed above. The L4 fracture appears acute/recent with bone marrow edema that involves the superior endplate and anterior pedicles bilaterally. Mild edema in the adjacent L3-L4 and L4-L5 discs is likely posttraumatic. There is mild bony retropulsion along the superior L4 and inferior L3 endplates with mild resulting canal stenosis at these levels. 2. Superimposed multilevel degenerative change, detailed above and greatest at L5-S1 where there is moderate right foraminal stenosis and mild right subarticular recess stenosis. Disc contacts the exiting right L5 and descending right S1 nerves.  Assessment & Plan:   1. Age-related osteoporosis without current pathological fracture Ms. Scinto's DXA is stable currently. Despite her noting no prior osteoporotic fractures, her MRI had shown multiple spinal compression fractures which are almost certainly related to her osteoporosis.  We will continue her on Prolia and she will continue calcium and Vit D supplementation.  2. Vitamin D deficiency We will recheck her Vit D level for surveillance.  - VITAMIN D 25 Hydroxy (Vit-D Deficiency, Fractures)  3. Iron deficiency anemia, unspecified iron deficiency anemia type We will recheck her CBC and iron studies for surveillance./  - CBC - Iron, TIBC and Ferritin Panel  4. Allergic rhinitis, unspecified seasonality, unspecified trigger Ms. Coppolino's exam is consistent with allergic rhinitis. I recommend we have her start a nasal steroid spray and monitor for improvement in her chronic throat clearing.  - fluticasone (FLONASE) 50 MCG/ACT nasal spray; Place 2 sprays into both nostrils daily.  Dispense: 16 g; Refill: 6  Haydee Salter, MD

## 2021-11-06 LAB — IRON,TIBC AND FERRITIN PANEL
%SAT: 37 % (calc) (ref 16–45)
Ferritin: 45 ng/mL (ref 16–288)
Iron: 118 ug/dL (ref 45–160)
TIBC: 316 mcg/dL (calc) (ref 250–450)

## 2021-11-10 ENCOUNTER — Encounter: Payer: Self-pay | Admitting: Family Medicine

## 2022-01-18 ENCOUNTER — Encounter: Payer: Self-pay | Admitting: Family Medicine

## 2022-01-23 NOTE — Telephone Encounter (Signed)
Pt called back regarding this message

## 2022-02-04 ENCOUNTER — Other Ambulatory Visit: Payer: Self-pay

## 2022-02-04 ENCOUNTER — Ambulatory Visit (INDEPENDENT_AMBULATORY_CARE_PROVIDER_SITE_OTHER): Payer: Medicare HMO

## 2022-02-04 DIAGNOSIS — M810 Age-related osteoporosis without current pathological fracture: Secondary | ICD-10-CM

## 2022-02-04 MED ORDER — DENOSUMAB 60 MG/ML ~~LOC~~ SOSY
60.0000 mg | PREFILLED_SYRINGE | Freq: Once | SUBCUTANEOUS | Status: AC
  Administered 2022-02-04: 60 mg via SUBCUTANEOUS

## 2022-02-04 NOTE — Progress Notes (Signed)
After obtaining consent, and per orders of Dr. Gena Fray, injection of Prolia given by Lynda Rainwater left deltoid. Patient instructed to remain in clinic for 20 minutes afterwards, and to report any adverse reaction to me immediately. Last injection 08/06/21.  ?

## 2022-04-08 NOTE — Telephone Encounter (Signed)
Patient had last Prolia on 02/04/22 next injection due 08/07/22 will call to schedule closer to due date.  ?

## 2022-04-13 ENCOUNTER — Other Ambulatory Visit: Payer: Self-pay | Admitting: Family Medicine

## 2022-04-14 ENCOUNTER — Other Ambulatory Visit (HOSPITAL_BASED_OUTPATIENT_CLINIC_OR_DEPARTMENT_OTHER): Payer: Self-pay | Admitting: Family Medicine

## 2022-04-14 DIAGNOSIS — Z1231 Encounter for screening mammogram for malignant neoplasm of breast: Secondary | ICD-10-CM

## 2022-04-25 ENCOUNTER — Encounter: Payer: Self-pay | Admitting: Family Medicine

## 2022-05-21 ENCOUNTER — Ambulatory Visit: Payer: Medicare HMO

## 2022-05-27 ENCOUNTER — Ambulatory Visit (INDEPENDENT_AMBULATORY_CARE_PROVIDER_SITE_OTHER): Payer: Medicare HMO | Admitting: Family Medicine

## 2022-05-27 ENCOUNTER — Encounter: Payer: Self-pay | Admitting: Family Medicine

## 2022-05-27 VITALS — BP 127/78 | HR 63 | Temp 97.8°F | Ht 64.0 in | Wt 129.8 lb

## 2022-05-27 DIAGNOSIS — K5909 Other constipation: Secondary | ICD-10-CM

## 2022-05-27 DIAGNOSIS — E559 Vitamin D deficiency, unspecified: Secondary | ICD-10-CM

## 2022-05-27 DIAGNOSIS — R030 Elevated blood-pressure reading, without diagnosis of hypertension: Secondary | ICD-10-CM | POA: Diagnosis not present

## 2022-05-27 DIAGNOSIS — M810 Age-related osteoporosis without current pathological fracture: Secondary | ICD-10-CM | POA: Diagnosis not present

## 2022-05-27 NOTE — Progress Notes (Signed)
[facility] [address] [place] [address] Dept: [phone] Dept Fax: [phone]  Chronic Care Office Visit  Subjective:    Patient ID: Shelley Snyder, female    DOB: [birth year], 81 y.o..   MRN: [id number]  Chief Complaint  Patient presents with   Follow-up    F/u meds.   No concerns.  Fasting today.     History of Present Illness:  Patient is in today for reassessment of chronic medical issues.  Ms. Hinks has a history of osteoporosis. She is currently managed on Prolia and takes daily calcium and Vit D supplements. She has a past history of a spinal compression fracture. Her next injection will be due ~ 08/07/2022.   Ms. Gladwin has an issue with chronic constipation. She uses fiber and a probiotic for regulating her bowels. She has had a past issues with microscopic colitis. This was associated with some iron deficiency anemia. She notes this is all resovled now. She is back to having regular owel movements with her current regimen.   Ms. Lienhard has a history of "white coat" hypertensin. She notes she montiors her blood pressure regularly at home and these are much more normal. Her recent home BP was 127/78.  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 Up to 2 BID.     Ascorbic Acid (VITAMIN C) 1000 MG tablet Take 1,000 mg by mouth daily.     Calcium Carb-Cholecalciferol 600-800 MG-UNIT TABS Take 2 tablets by mouth daily.     Cholecalciferol (VITAMIN D-3 PO) Take by mouth.     lactobacillus acidophilus (BACID) TABS tablet Take 2 tablets by mouth 3 (three) times daily.     Melatonin 10 MG TABS Take by mouth.     Multiple Vitamins-Minerals (MULTIVITAMIN ADULTS 50+) TABS Take by mouth.     fluticasone (FLONASE) 50 MCG/ACT nasal spray Place 2 sprays into both nostrils daily. 16 g 6   Psyllium (QC NATURAL VEGETABLE) 95 % POWD Take by mouth.     No facility-administered medications prior to visit.   No Known Allergies    Objective:   Today's Vitals   05/27/22 1001 05/27/22 1005  BP: (!) 144/90 127/78  Pulse: 63   Temp: 97.8 F (36.6 C)   TempSrc: Temporal   SpO2: 99%   Weight: 129 lb 12.8 oz (58.9 kg)   Height: '5\' 4"'$  (1.626 m)  Body mass index is 22.28 kg/m.   General: Well developed, well nourished. No acute distress. Psych: Alert and oriented. Normal mood and affect.  There are no preventive care reminders to display for this patient.    Assessment & Plan:   1. 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 Ms. Laursen is aware of my concern that we not miss treating significant hypertension. I have asked her to share her home BP log when she has a chance.  2. Chronic constipation Managed well with her probiotic.  3. Age-related osteoporosis without current pathological fracture Currently on Prolia 60 mg every 6 months without  complaint. Continue supplement with calcium and Vit D.  4. Vitamin D deficiency Continue daily Vitamin D supplementation.   Return in about 6 months (around 11/26/2022) for Reassessment.   Haydee Salter, MD

## 2022-06-11 ENCOUNTER — Telehealth (INDEPENDENT_AMBULATORY_CARE_PROVIDER_SITE_OTHER): Payer: Medicare HMO | Admitting: Family Medicine

## 2022-06-11 VITALS — BP 116/76 | Ht 64.0 in | Wt 126.0 lb

## 2022-06-11 DIAGNOSIS — A084 Viral intestinal infection, unspecified: Secondary | ICD-10-CM

## 2022-06-11 NOTE — Progress Notes (Signed)
[facility] [address] [place] [address] Dept: [phone] Dept Fax: [phone]  Telephone Visit  I connected with Shelley Snyder on 06/11/22 at  9:00 AM EDT by telephone and verified that I am speaking with the correct person using two identifiers.  Location patient: Home Location provider: Clinic Persons participating in the virtual visit: Patient, Provider  I discussed the limitations of evaluation and management by telemedicine and the availability of in person appointments. The patient expressed understanding and agreed to proceed. We converted to a telephone visit, as the video/audio connection was very poor and then failed.  Chief Complaint  Patient presents with   Acute Visit    C/o having N&V that started on Sunday, then got better and has had diarrhea x 2 days.  Has taken Imodium.      SUBJECTIVE:  HPI: Shelley Snyder is a 81 y.o. female who presents having developed nausea with vomiting on Sunday. This continued Monday, so she took an OTC medication to settle her stomach. By Tuesday, she had developed diarrhea. Yesterday, this had become more watery. The pharmacist recommended she use Imodium, which has now resolved the issue. Today, she is having neither N/V/D. She denies any fever or blood in her stools. She has not eaten any food she thinks might have been bad. She has a past history of microscopic colitis, so admits to having worried about recurrence. She is planning to leave on 7/14 for a [place] cruise.  Past Medical History:  Diagnosis Date   Allergy    Past Surgical History:  Procedure Laterality Date   ABDOMINAL HYSTERECTOMY     aprtial   BREAST SURGERY     PERINEAL PROCTECTOMY     RECTAL PROLAPSE REPAIR     TONSILLECTOMY     Family History  Problem Relation Age of Onset   Cancer Mother        Lung   Cancer Father        Mesothelioma   Social History   Tobacco Use   Smoking  status: Never   Smokeless tobacco: Never  Vaping Use   Vaping Use: Never used  Substance Use Topics   Alcohol use: Yes    Alcohol/week: 7.0 standard drinks of alcohol    Types: 7 Glasses of wine per week   Drug use: No    Current Outpatient Medications:    acetaminophen (TYLENOL) 650 MG CR tablet, Take 650 mg by mouth every 8 (eight) hours as needed for pain. Up to 2 BID., Disp: , Rfl:    Ascorbic Acid (VITAMIN C) 1000 MG tablet, Take 1,000 mg by mouth daily., Disp: , Rfl:    Calcium Carb-Cholecalciferol 600-800 MG-UNIT TABS, Take 2 tablets by mouth daily., Disp: , Rfl:    Cholecalciferol (VITAMIN D-3 PO), Take by mouth., Disp: , Rfl:    denosumab (PROLIA) 60 MG/ML SOSY injection, Inject 60 mg into the skin every 6 (six) months., Disp: , Rfl:    lactobacillus acidophilus (BACID) TABS tablet, Take 2 tablets by mouth 3 (three) times daily., Disp: , Rfl:    Melatonin 10 MG TABS, Take by mouth., Disp: , Rfl:    Multiple Vitamins-Minerals (MULTIVITAMIN ADULTS 50+) TABS, Take by mouth., Disp: , Rfl:   No Known Allergies  ROS: See pertinent positives and negatives per HPI.  OBSERVATIONS/OBJECTIVE:  VITALS per patient if applicable: Vitals:   62/83/66 0857  Weight: 126 lb (57.2 kg)  Height: '5\' 4"'$  (1.626 m)  ASSESSMENT AND PLAN:  1. Viral gastroenteritis As this seems resolved at this point, I recommend she monitor this for now. She can plan to take some Imodium with her on her upcoming cruise. Although relapses of colitis can occur, most occur within 6 months of stopping treatment, so this is unlikely to represent a relapse.   I discussed the assessment and treatment plan with the patient. The patient was provided an opportunity to ask questions and all were answered. The patient agreed with the plan and demonstrated an understanding of the instructions.   The patient was advised to call back or seek an in-person evaluation if the symptoms worsen or if the condition fails to improve  as anticipated.  Return if symptoms worsen or fail to improve.   I spent 15 minutes on this telephone encounter.  Haydee Salter, MD

## 2022-06-17 ENCOUNTER — Ambulatory Visit (HOSPITAL_BASED_OUTPATIENT_CLINIC_OR_DEPARTMENT_OTHER)
Admission: RE | Admit: 2022-06-17 | Discharge: 2022-06-17 | Disposition: A | Discharge status: Home / Self Care | Payer: Medicare HMO | Source: Ambulatory Visit | Attending: Family Medicine | Admitting: Family Medicine

## 2022-06-17 ENCOUNTER — Encounter (HOSPITAL_BASED_OUTPATIENT_CLINIC_OR_DEPARTMENT_OTHER): Payer: Self-pay

## 2022-06-17 DIAGNOSIS — Z1231 Encounter for screening mammogram for malignant neoplasm of breast: Secondary | ICD-10-CM | POA: Insufficient documentation

## 2022-07-07 ENCOUNTER — Encounter: Payer: Self-pay | Admitting: Family Medicine

## 2022-07-07 ENCOUNTER — Ambulatory Visit (INDEPENDENT_AMBULATORY_CARE_PROVIDER_SITE_OTHER): Payer: Medicare HMO | Admitting: Family Medicine

## 2022-07-07 VITALS — BP 124/76 | HR 95 | Temp 98.3°F | Ht 64.0 in | Wt 129.8 lb

## 2022-07-07 DIAGNOSIS — U071 COVID-19: Secondary | ICD-10-CM

## 2022-07-07 MED ORDER — GUAIFENESIN-CODEINE 100-10 MG/5ML PO SOLN: 5.0000 mL | Freq: Three times a day (TID) | ORAL | 0 refills | Status: DC | PRN

## 2022-07-07 MED ORDER — NIRMATRELVIR/RITONAVIR (PAXLOVID)TABLET: 3.0000 | ORAL_TABLET | Freq: Two times a day (BID) | ORAL | 0 refills | Status: AC

## 2022-07-07 NOTE — Progress Notes (Signed)
[facility] [address] [place] [address] Dept: [phone] Dept Fax: [phone]  Office Visit  Subjective:    Patient ID: Shelley Snyder, female    DOB: October 27, 1941, 81 y.o..   MRN: [id number]  Chief Complaint  Patient presents with   Acute Visit    C/o having sinus drainage, cough, HA's, not able to sleep x 4-5 days.  She has taken Sudafed, Advil, with little relief.  She recently returned from cruise to [place].      History of Present Illness:  Patient is in today complaining of a 4-5 day history of sinus congestion, post nasal drip, cough, poor appetite and headache. Shelley Snyder was in the [place] on a cruise the past 2 weeks. She started to have some of these symptoms on Friday, just prior to flying back to the [place]. She has been using Sudafed and taking Advil.  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 Up to 2 BID.     Ascorbic Acid  (VITAMIN C) 1000 MG tablet Take 1,000 mg by mouth daily.     Calcium Carb-Cholecalciferol 600-800 MG-UNIT TABS Take 2 tablets by mouth daily.     Cholecalciferol (VITAMIN D-3 PO) Take by mouth.     denosumab (PROLIA) 60 MG/ML SOSY injection Inject 60 mg into the skin every 6 (six) months.     lactobacillus acidophilus (BACID) TABS tablet Take 2 tablets by mouth 3 (three) times daily.     Melatonin 10 MG TABS Take by mouth.     Multiple Vitamins-Minerals (MULTIVITAMIN ADULTS 50+) TABS Take by mouth.     No facility-administered medications prior to visit.   No Known Allergies    Objective:   Today's Vitals   07/07/22 1333  BP: 124/76  Pulse: 95  Temp: 98.3 F (36.8 C)  TempSrc: Temporal  SpO2: 96%  Weight: 129 lb 12.8 oz (58.9 kg)  Height: '5\' 4"'$  (1.626 m)   Body mass index is 22.28 kg/m.   General: Well developed, well nourished. No acute distress. HEENT: Normocephalic, non-traumatic. Eyes clear. EAC and TMs normal bilaterally. Nose    clear without congestion or rhinorrhea. Mucous membranes moist. Moderate streaking of the posterior   oropharynx clear. Good dentition. Neck: Supple. No lymphadenopathy. No thyromegaly. Lungs: Clear to auscultation bilaterally. No wheezing, rales or rhonchi. CV: RRR without murmurs or rubs. Pulses 2+ bilaterally. Psych: Alert and oriented. Normal  mood and affect.  Health Maintenance Due  Topic Date Due   INFLUENZA VACCINE  07/07/2022   Lab Results POCT COVID: Positive    Assessment & Plan:   1. COVID-19 Reviewed home care instructions for COVID. Advised self-isolation at home for at least 5 days. After 5 days, if improved and fever resolved, can be in public, but should wear a mask around others for an additional 5 days. If symptoms, esp, dyspnea develops/worsens, recommend in-person evaluation at either an urgent care or the emergency room.  - nirmatrelvir/ritonavir EUA (PAXLOVID) 20 x 150 MG & 10 x '100MG'$  TABS; Take 3 tablets by mouth 2  (two) times daily for 5 days. (Take nirmatrelvir 150 mg two tablets twice daily for 5 days and ritonavir 100 mg one tablet twice daily for 5 days) Patient GFR is 65  Dispense: 30 tablet; Refill: 0 - guaiFENesin-codeine 100-10 MG/5ML syrup; Take 5 mLs by mouth 3 (three) times daily as needed for cough.  Dispense: 120 mL; Refill: 0   Return if symptoms worsen or fail to improve.   Haydee Salter, MD

## 2022-07-13 ENCOUNTER — Encounter: Payer: Self-pay | Admitting: Family Medicine

## 2022-07-14 ENCOUNTER — Ambulatory Visit (INDEPENDENT_AMBULATORY_CARE_PROVIDER_SITE_OTHER): Payer: Medicare HMO | Admitting: Family Medicine

## 2022-07-14 ENCOUNTER — Ambulatory Visit (INDEPENDENT_AMBULATORY_CARE_PROVIDER_SITE_OTHER): Payer: Medicare HMO

## 2022-07-14 VITALS — BP 122/70 | HR 100 | Temp 98.6°F | Ht 64.0 in | Wt 127.6 lb

## 2022-07-14 DIAGNOSIS — S2241XA Multiple fractures of ribs, right side, initial encounter for closed fracture: Secondary | ICD-10-CM | POA: Diagnosis not present

## 2022-07-14 DIAGNOSIS — U071 COVID-19: Secondary | ICD-10-CM | POA: Diagnosis not present

## 2022-07-14 DIAGNOSIS — J984 Other disorders of lung: Secondary | ICD-10-CM | POA: Diagnosis not present

## 2022-07-14 DIAGNOSIS — K449 Diaphragmatic hernia without obstruction or gangrene: Secondary | ICD-10-CM

## 2022-07-14 DIAGNOSIS — J929 Pleural plaque without asbestos: Secondary | ICD-10-CM | POA: Diagnosis not present

## 2022-07-14 DIAGNOSIS — J40 Bronchitis, not specified as acute or chronic: Secondary | ICD-10-CM

## 2022-07-14 MED ORDER — HYDROCODONE BIT-HOMATROP MBR 5-1.5 MG/5ML PO SOLN: 5.0000 mL | Freq: Three times a day (TID) | ORAL | 0 refills | Status: DC | PRN

## 2022-07-14 NOTE — Patient Instructions (Signed)
Request pseudoephedrine (Sudafed) from pharmacist. Use every 8-12 hours for congestion. Use chlorpheniramine 4 mg every 6 hours for runny nose.

## 2022-07-14 NOTE — Progress Notes (Signed)
[facility] [address] [place] [address] Dept: [phone] Dept Fax: [phone]  Office Visit  Subjective:    Patient ID: Shelley Snyder, female    DOB: [birth year], 81 y.o..   MRN: [id number]  Chief Complaint  Patient presents with   Acute Visit    C/o still having cough and running nose after having Covid 12 days ago.      History of Present Illness:  Patient is in today for reassessment of her COVID infection. I saw Ms. Ohmer last week with URI symptoms that developed near the end of a [place]. She tested positive for COVID in the office. I prescribed a course of Paxlovid. She notes that since last week, she has continued to have profuse rhinorrhea, some nasal congestion, esp. in the morning, and significant cough. This is productive of mucous. She notes she has significant coughing spells at times during the night. She has been using a Mucinex multi-symptom preperaiton and some nasal saline spray. I had also provide some Robitussin AC for cough. She has been takign melatonin the last few nights to try and help her sleep. She denies any fever and has no pain over her sinuses.  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 Up to 2 BID.     Ascorbic Acid (VITAMIN C) 1000 MG tablet Take 1,000 mg by mouth daily.     Calcium Carb-Cholecalciferol 600-800 MG-UNIT TABS Take 2 tablets by mouth daily.     Cholecalciferol (VITAMIN D-3 PO) Take by mouth.     denosumab (PROLIA) 60 MG/ML SOSY injection Inject 60 mg into the skin every 6 (six) months.     guaiFENesin-codeine 100-10 MG/5ML syrup Take 5 mLs by mouth 3 (three) times daily as needed for cough. 120 mL 0   lactobacillus acidophilus (BACID) TABS tablet Take 2 tablets by mouth 3 (three) times daily.     Melatonin 10 MG TABS Take by mouth.     Multiple Vitamins-Minerals (MULTIVITAMIN ADULTS 50+) TABS Take by mouth.     No facility-administered medications prior to visit.   No Known Allergies    Objective:   Today's Vitals   07/14/22 1458  BP: 122/70  Pulse: 100  Temp: 98.6 F (37 C)  TempSrc: Temporal  SpO2: 97%  Weight: 127 lb 9.6 oz (57.9 kg)  Height: '5\' 4"'$  (1.626 m)   Body mass  index is 21.9 kg/m.   General: Well developed, well nourished. No acute distress. HEENT: Normocephalic, non-traumatic. Conjunctiva clear. External ears normal. EAC and TMs normal bilaterally.   Nose  with moderate congestion and rhinorrhea. No pain on percussion over the sinuses. Mucous membranes moist.   Moderate mucous streaking of the posterior oropharynx. Neck: Supple. No lymphadenopathy. No thyromegaly. Lungs: Clear to auscultation bilaterally. No wheezing, rales or rhonchi. CV: RRR without murmurs or rubs. Pulses 2+ bilaterally. Psych: Alert and oriented. Normal mood and affect.  Health Maintenance Due  Topic Date Due   INFLUENZA VACCINE  07/07/2022   Imaging: Chest  x-ray- No acute infiltrates. There is a moderately large hiatal hernia present.    Assessment & Plan:   1. COVID-19 2. Bronchitis There appears to be a bronchitic component to Ms. Borsuk's COVID infection. I do not see an indication for an antibiotic at present. Much of her cough may also be being contributed to by her PND. I recommend she stop using the multi-symptom medication. She should request pseudoephedrine from the pharmacist for congestion. I recommend chlorpheniramine for her rhinorrhea. I will provide some Hycodan for her cough. I will plan to reassess ehr next week.  - DG Chest 2 View - HYDROcodone bit-homatropine (HYCODAN) 5-1.5 MG/5ML syrup; Take 5 mLs by mouth every 8 (eight) hours as needed for cough.  Dispense: 120 mL; Refill: 0  3. Hiatal hernia Noted on x-ray. We discussed symptoms she might have related to this. We will monitor for now.   Return in about 1 week (around 07/21/2022) for Reassessment.   Haydee Salter, MD

## 2022-07-14 NOTE — Progress Notes (Unsigned)
Follow up visit. Recently dx with COVID. Still experiencing a productive cough and nasal drainage

## 2022-07-15 ENCOUNTER — Telehealth: Payer: Self-pay

## 2022-07-15 NOTE — Telephone Encounter (Signed)
Patient will be due for her next Prolia on 08/07/22.  Can you please and thank you check on her coverage and then we can get her scheduled for her nurse visit.   Thanks. Dm/cma

## 2022-07-27 DIAGNOSIS — L578 Other skin changes due to chronic exposure to nonionizing radiation: Secondary | ICD-10-CM | POA: Diagnosis not present

## 2022-07-27 DIAGNOSIS — D225 Melanocytic nevi of trunk: Secondary | ICD-10-CM | POA: Diagnosis not present

## 2022-07-27 DIAGNOSIS — L814 Other melanin hyperpigmentation: Secondary | ICD-10-CM | POA: Diagnosis not present

## 2022-07-27 DIAGNOSIS — L820 Inflamed seborrheic keratosis: Secondary | ICD-10-CM | POA: Diagnosis not present

## 2022-08-11 ENCOUNTER — Encounter: Payer: Self-pay | Admitting: Family Medicine

## 2022-08-12 ENCOUNTER — Ambulatory Visit: Payer: Medicare HMO

## 2022-08-18 DIAGNOSIS — Z79899 Other long term (current) drug therapy: Secondary | ICD-10-CM | POA: Diagnosis not present

## 2022-08-18 DIAGNOSIS — R030 Elevated blood-pressure reading, without diagnosis of hypertension: Secondary | ICD-10-CM | POA: Diagnosis not present

## 2022-08-18 DIAGNOSIS — J309 Allergic rhinitis, unspecified: Secondary | ICD-10-CM | POA: Diagnosis not present

## 2022-08-18 DIAGNOSIS — Z809 Family history of malignant neoplasm, unspecified: Secondary | ICD-10-CM | POA: Diagnosis not present

## 2022-08-18 DIAGNOSIS — Z8249 Family history of ischemic heart disease and other diseases of the circulatory system: Secondary | ICD-10-CM | POA: Diagnosis not present

## 2022-08-18 DIAGNOSIS — Z87891 Personal history of nicotine dependence: Secondary | ICD-10-CM | POA: Diagnosis not present

## 2022-08-18 DIAGNOSIS — Z79620 Long term (current) use of immunosuppressive biologic: Secondary | ICD-10-CM | POA: Diagnosis not present

## 2022-08-18 DIAGNOSIS — M810 Age-related osteoporosis without current pathological fracture: Secondary | ICD-10-CM | POA: Diagnosis not present

## 2022-08-19 ENCOUNTER — Telehealth: Payer: Self-pay

## 2022-08-19 ENCOUNTER — Ambulatory Visit (INDEPENDENT_AMBULATORY_CARE_PROVIDER_SITE_OTHER): Payer: Medicare HMO

## 2022-08-19 ENCOUNTER — Encounter: Payer: Self-pay | Admitting: Family Medicine

## 2022-08-19 DIAGNOSIS — M810 Age-related osteoporosis without current pathological fracture: Secondary | ICD-10-CM

## 2022-08-19 IMAGING — DX DG LUMBAR SPINE 2-3V
3 series · 3 of 3 positions shown · non-contrast
Comparison: None.

CLINICAL DATA: 79-year-old female with acute onset low back pain
radiating to the buttocks when bending.

EXAM:
LUMBAR SPINE - 2-3 VIEW

[l-spine ap]
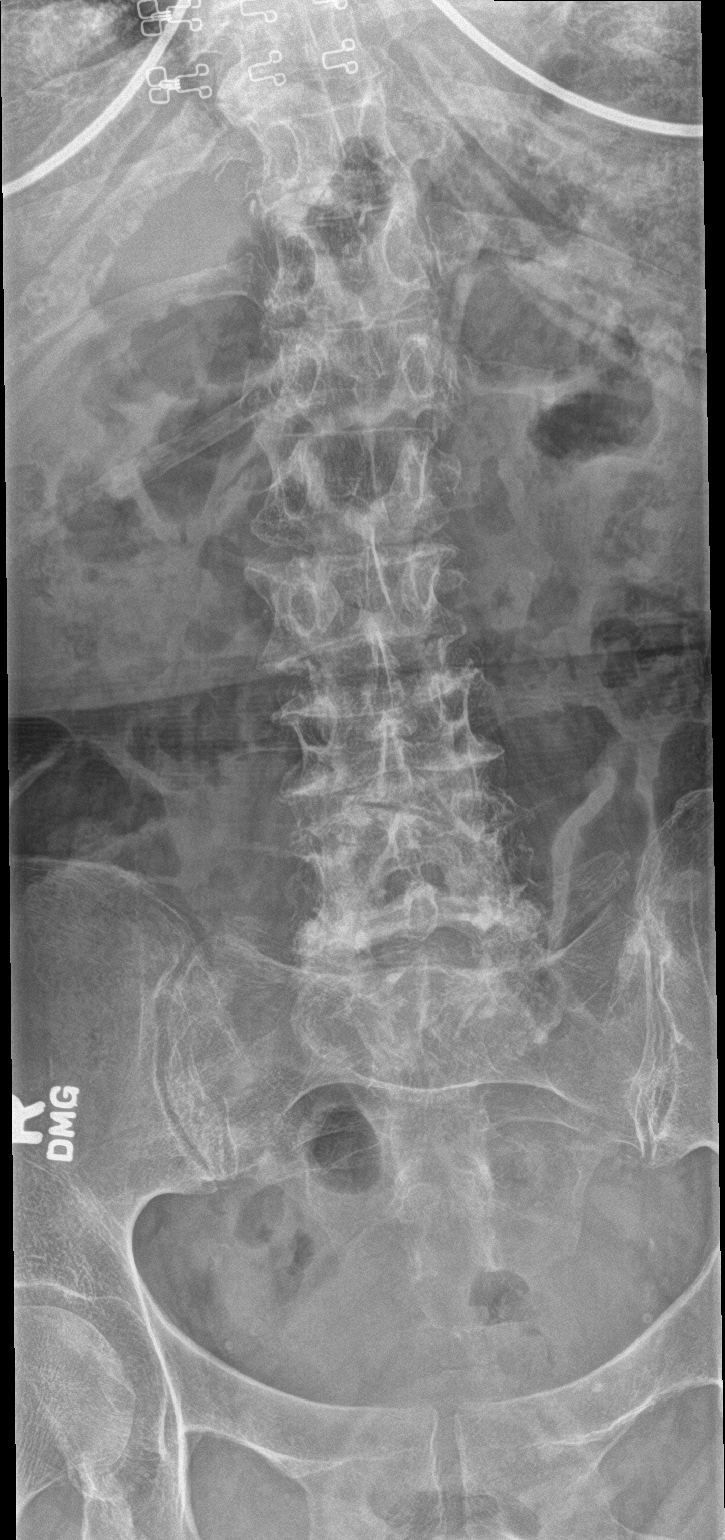

[l-spine lat]
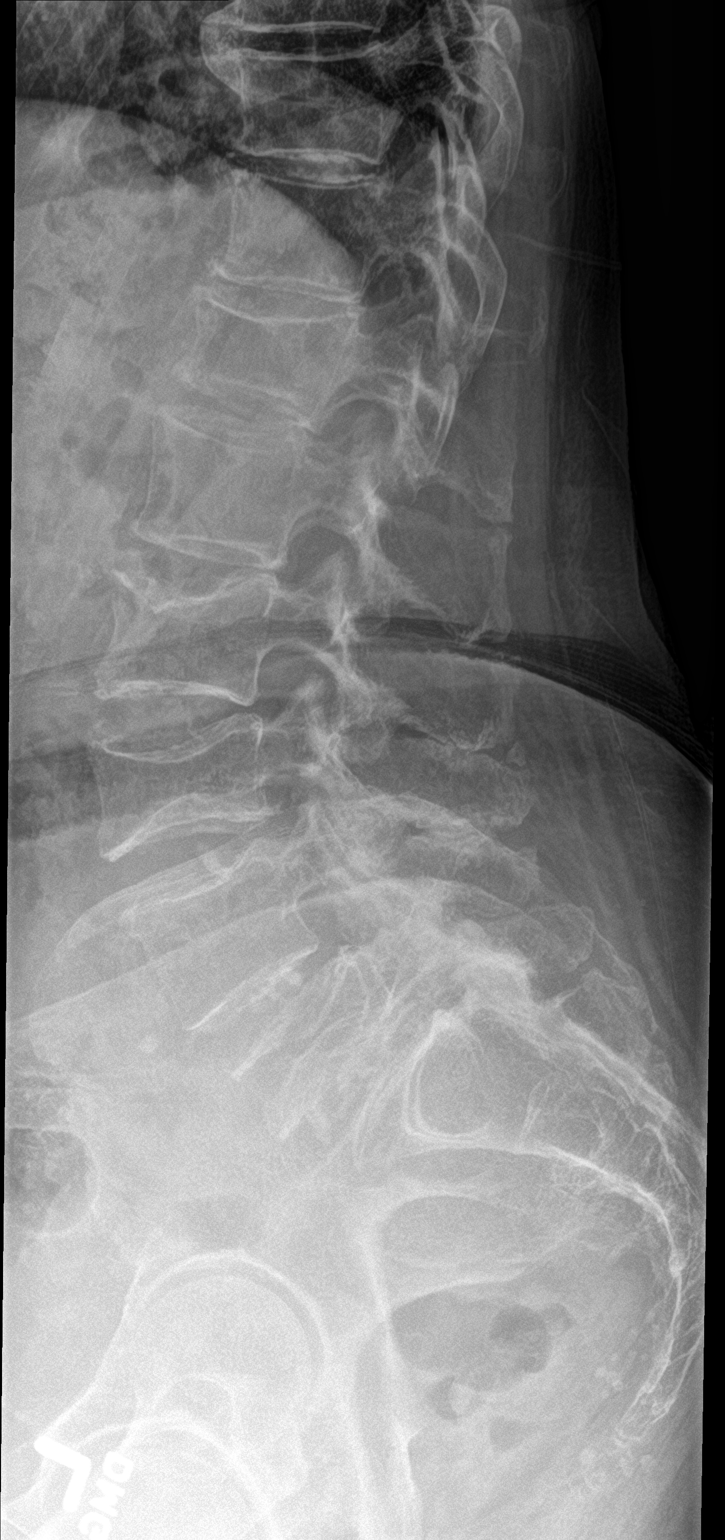

[l-spine spot]
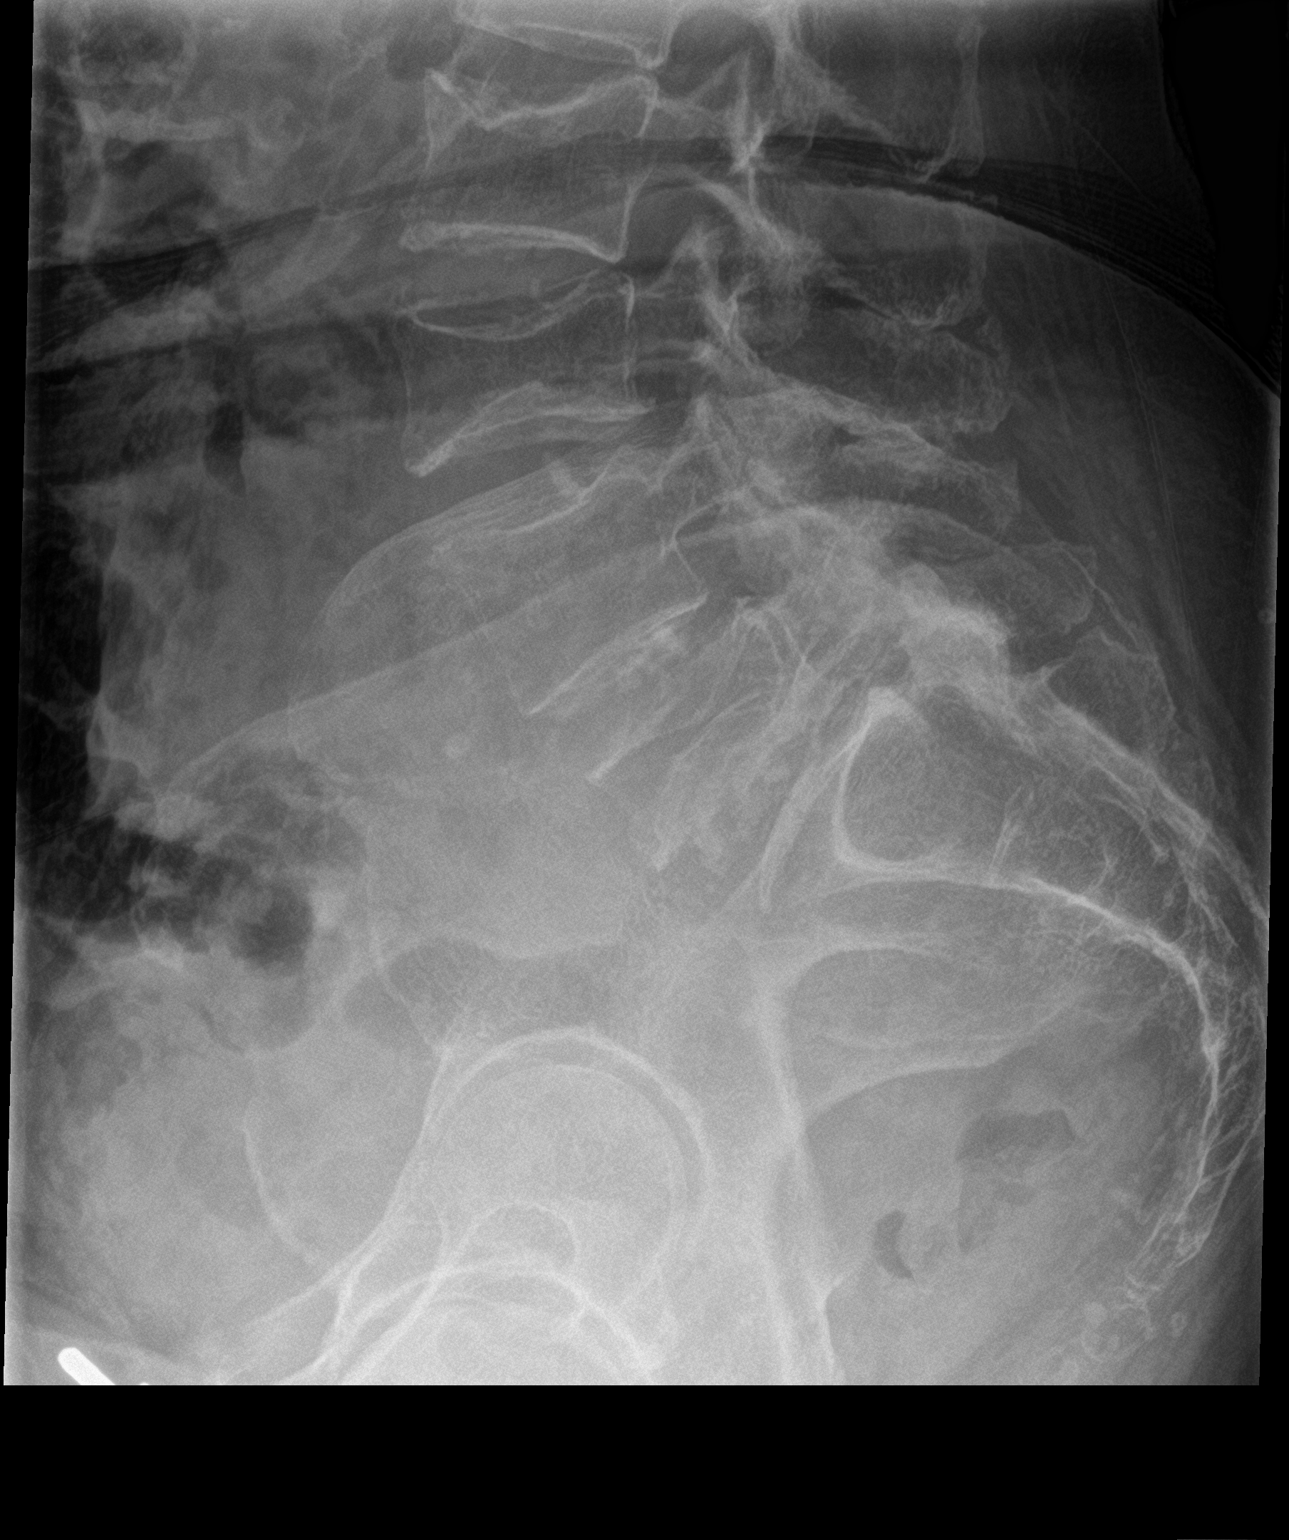

[3 of 3 positions shown; findings below may reference images not displayed]

FINDINGS: Normal lumbar segmentation.  Osteopenia.  Preserved lumbar lordosis.

Compression fractures of T12, L2, L3 and L5, most are moderate. The
fractures of L2 and L5 appear most recent by radiography.
Additionally, there is a more subtle L4 superior endplate
compression also. This is age indeterminate. No significant
retropulsion of bone. Moderate superimposed degenerative lumbar
facet hypertrophy. Grossly intact visible sacrum and SI joints.

Negative abdominal visceral contours.
IMPRESSION: Osteopenia with lower thoracic and lumbar compression fractures
sparing only the L1 level. Multiple levels are age indeterminate. If
specific therapy is desired, Lumbar MRI without contrast or Nuclear
Medicine Whole-body Bone Scan would best confirm candidacy for
vertebroplasty.

## 2022-08-19 MED ORDER — DENOSUMAB 60 MG/ML ~~LOC~~ SOSY
60.0000 mg | PREFILLED_SYRINGE | Freq: Once | SUBCUTANEOUS | Status: AC
  Administered 2022-08-19: 60 mg via SUBCUTANEOUS

## 2022-08-19 NOTE — Telephone Encounter (Signed)
Patient in office today for Prolia injection and asked if she could have Dr. Juliane Lack opinion on the RSV vaccine if this is a recommended vaccine that she should have? Patient states that she had her flu vaccine today and will be having covid booster soon. No sure if she should have RSV vaccine? Please advise. Per patient you can respond via Mychart if that is fine with you.

## 2022-08-19 NOTE — Progress Notes (Signed)
After obtaining consent, and per orders of Dr. Rudd, injection of Prolia given by Yandriel Boening. Patient instructed to remain in clinic for 20 minutes afterwards, and to report any adverse reaction to me immediately.  

## 2022-08-28 IMAGING — DX DG PELVIS 1-2V
1 series · 1 of 1 positions shown · non-contrast
Comparison: 04/06/2021

CLINICAL DATA: Pelvic and right hip pain

EXAM:
PELVIS - 1-2 VIEW

[pelvis ap]
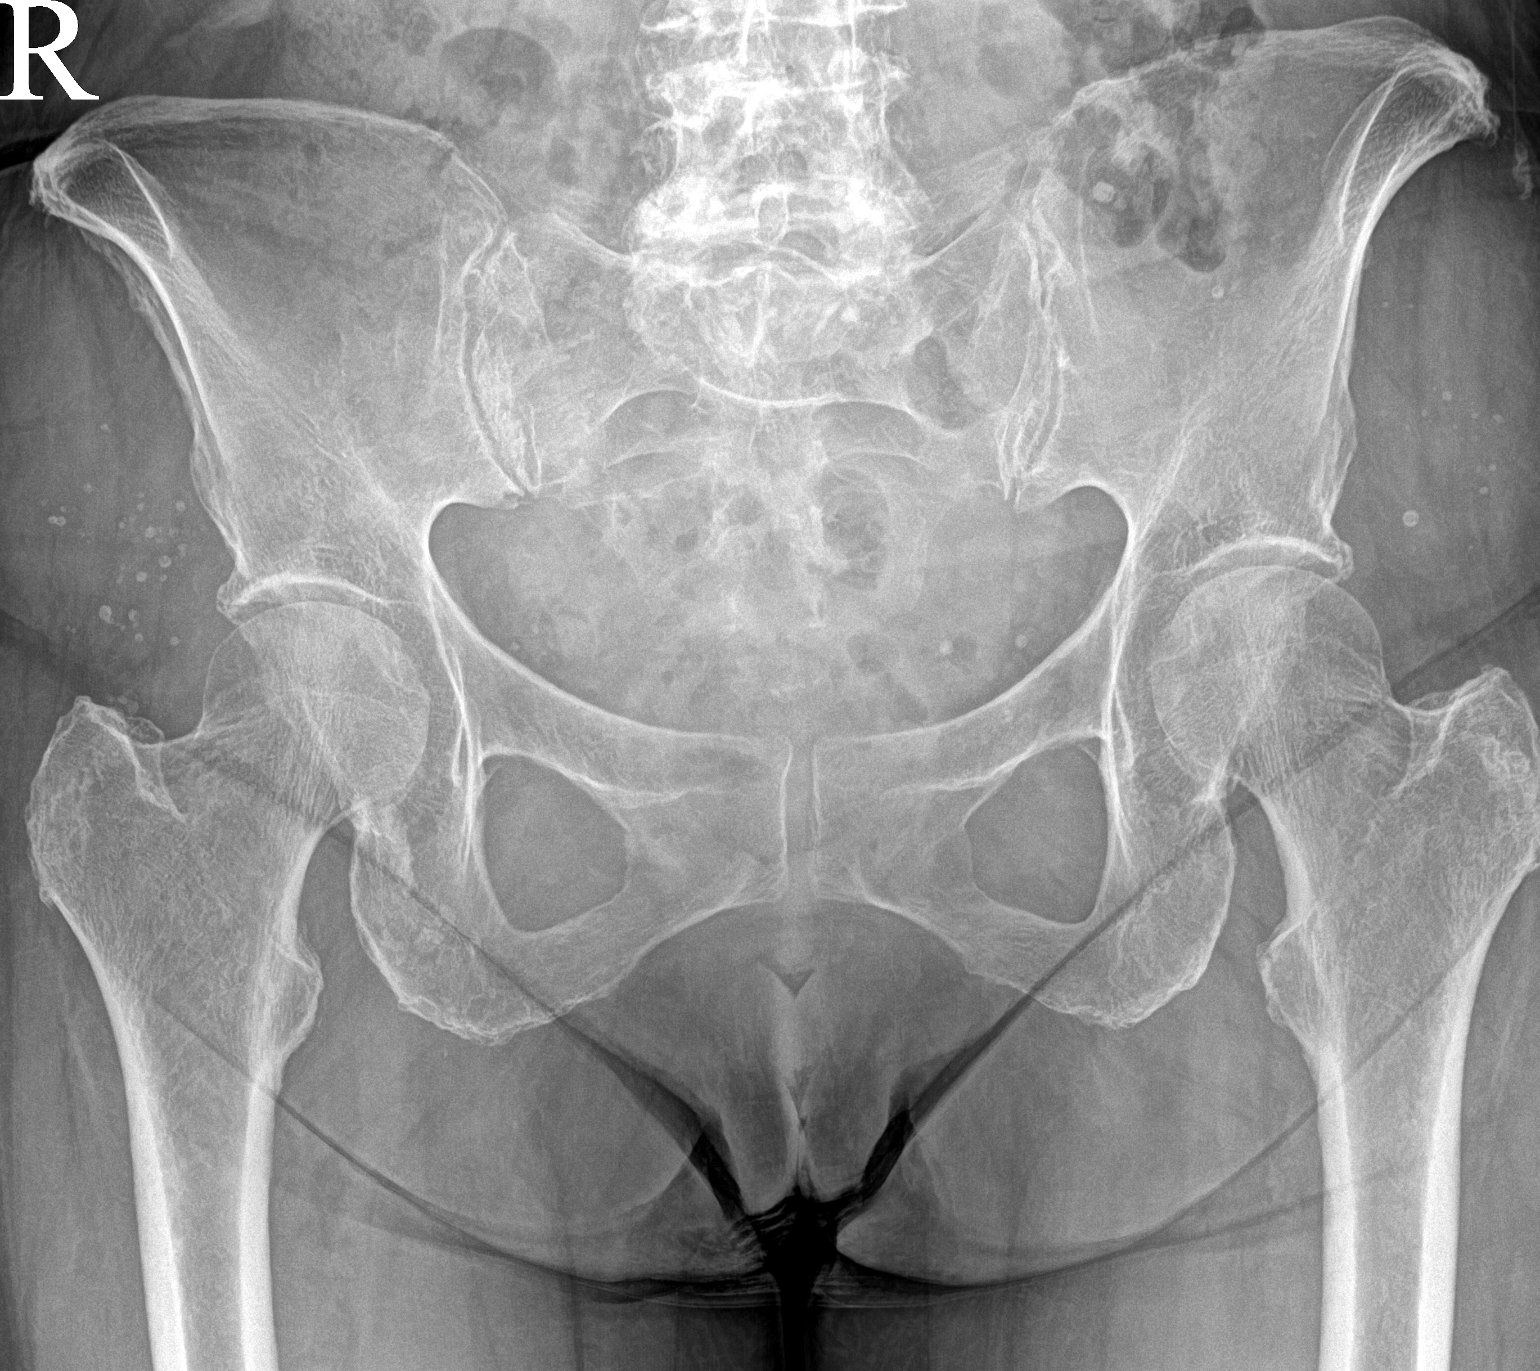

[1 of 1 positions shown; findings below may reference images not displayed]

FINDINGS: Single frontal view of the pelvis demonstrates no acute displaced
fractures. There is mild symmetrical bilateral hip osteoarthritis.
Sacroiliac joints are normal. Stable spondylosis at the lumbosacral
junction.
IMPRESSION: 1. Mild symmetrical bilateral hip osteoarthritis. No acute bony
abnormality.

## 2022-10-06 ENCOUNTER — Telehealth: Payer: Self-pay

## 2022-10-06 NOTE — Patient Outreach (Signed)
  Care Coordination   10/06/2022 Name: Shelley Snyder MRN: [id number] DOB: May 29, 1941   Care Coordination Outreach Attempts:  An unsuccessful telephone outreach was attempted today to offer the patient information about available care coordination services as a benefit of their health plan.   Follow Up Plan:  Additional outreach attempts will be made to offer the patient care coordination information and services.   Encounter Outcome:  No Answer  Care Coordination Interventions Activated:  No   Care Coordination Interventions:  No, not indicated    Johnney Killian, RN, BSN, CCM Care Management Coordinator [facility] Phone: [phone]: [phone]

## 2022-10-21 DIAGNOSIS — H26493 Other secondary cataract, bilateral: Secondary | ICD-10-CM | POA: Diagnosis not present

## 2022-10-21 DIAGNOSIS — Z135 Encounter for screening for eye and ear disorders: Secondary | ICD-10-CM | POA: Diagnosis not present

## 2022-10-21 DIAGNOSIS — H524 Presbyopia: Secondary | ICD-10-CM | POA: Diagnosis not present

## 2022-11-16 ENCOUNTER — Telehealth: Payer: Self-pay

## 2022-11-16 NOTE — Patient Outreach (Signed)
  Care Coordination   11/16/2022 Name: Shelley Snyder MRN: [id number] DOB: [birth year]   Care Coordination Outreach Attempts:  A second unsuccessful outreach was attempted today to offer the patient with information about available care coordination services as a benefit of their health plan.     Follow Up Plan:  Additional outreach attempts will be made to offer the patient care coordination information and services.   Encounter Outcome:  No Answer   Care Coordination Interventions:  No, not indicated    Jone Baseman, RN, MSN [place] [facility] Care Management Coordinator Direct Line [phone]

## 2022-11-18 ENCOUNTER — Ambulatory Visit (INDEPENDENT_AMBULATORY_CARE_PROVIDER_SITE_OTHER): Payer: Medicare HMO | Admitting: Family Medicine

## 2022-11-18 ENCOUNTER — Encounter: Payer: Self-pay | Admitting: Family Medicine

## 2022-11-18 VITALS — BP 138/82 | HR 69 | Temp 97.0°F | Ht 64.0 in | Wt 130.6 lb

## 2022-11-18 DIAGNOSIS — E559 Vitamin D deficiency, unspecified: Secondary | ICD-10-CM | POA: Diagnosis not present

## 2022-11-18 DIAGNOSIS — E7800 Pure hypercholesterolemia, unspecified: Secondary | ICD-10-CM

## 2022-11-18 DIAGNOSIS — M810 Age-related osteoporosis without current pathological fracture: Secondary | ICD-10-CM

## 2022-11-18 DIAGNOSIS — D509 Iron deficiency anemia, unspecified: Secondary | ICD-10-CM

## 2022-11-18 DIAGNOSIS — Z8781 Personal history of (healed) traumatic fracture: Secondary | ICD-10-CM

## 2022-11-18 DIAGNOSIS — I10 Essential (primary) hypertension: Secondary | ICD-10-CM | POA: Diagnosis not present

## 2022-11-18 LAB — BASIC METABOLIC PANEL
BUN: 20 mg/dL (ref 6–23)
CO2: 29 mEq/L (ref 19–32)
Calcium: 8.9 mg/dL (ref 8.4–10.5)
Chloride: 102 mEq/L (ref 96–112)
Creatinine, Ser: 0.75 mg/dL (ref 0.40–1.20)
GFR: 74.11 mL/min (ref >60.00)
Glucose, Bld: 90 mg/dL (ref 70–99)
Potassium: 3.7 mEq/L (ref 3.5–5.1)
Sodium: 139 mEq/L (ref 135–145)

## 2022-11-18 LAB — CBC
HCT: 39.1 % (ref 36.0–46.0)
Hemoglobin: 13.3 g/dL (ref 12.0–15.0)
MCHC: 33.9 g/dL (ref 30.0–36.0)
MCV: 94.9 fl (ref 78.0–100.0)
Platelets: 201 10*3/uL (ref 150.0–400.0)
RBC: 4.12 Mil/uL (ref 3.87–5.11)
RDW: 13.2 % (ref 11.5–15.5)
WBC: 4.9 10*3/uL (ref 4.0–10.5)

## 2022-11-18 LAB — LIPID PANEL
Cholesterol: 209 mg/dL — ABNORMAL HIGH (ref 0–200)
HDL: 82.8 mg/dL (ref >39.00)
LDL Cholesterol: 114 mg/dL — ABNORMAL HIGH (ref 0–99)
NonHDL: 125.81
Total CHOL/HDL Ratio: 3
Triglycerides: 60 mg/dL (ref 0.0–149.0)
VLDL: 12 mg/dL (ref 0.0–40.0)

## 2022-11-18 LAB — VITAMIN D 25 HYDROXY (VIT D DEFICIENCY, FRACTURES): VITD: 86.41 ng/mL (ref 30.00–100.00)

## 2022-11-18 NOTE — Progress Notes (Signed)
[facility] [address] [place] [address] Dept: [phone] Dept Fax: [phone]  Chronic Care Office Visit  Subjective:    Patient ID: Shelley Snyder, female    DOB: Nov 25, 1941, 81 y.o..   MRN: [id number]  Chief Complaint  Patient presents with   Follow-up    F/u meds.  Fasting today.  No concerns.       History of Present Illness:  Patient is in today for reassessment of chronic medical issues.  Shelley Snyder has a history of osteoporosis. She is currently managed on Prolia and takes daily calcium and Vit D supplements. She was previously on zoledronic acid (Reclast). She has a past history of a spinal compression fractures. She questions her need to continue on Prolia.    Shelley Snyder previously has noted "white coat" hypertension. Her blood pressure in th office has often been in a Stage 2 hypertension range. She does monitor her blood pressures at home and brough in a log of her pressures today.  Shelley Snyder has a history of hyperlipidemia. She is not on medication.  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 Up to 2 BID.     Ascorbic Acid (VITAMIN C) 1000 MG tablet Take 1,000 mg by mouth daily.     Calcium Carb-Cholecalciferol 600-800 MG-UNIT TABS Take 2 tablets by mouth daily.     Cholecalciferol (VITAMIN D-3 PO) Take by mouth.     denosumab (PROLIA) 60 MG/ML SOSY injection Inject 60 mg into the skin every 6 (six) months.     lactobacillus acidophilus (BACID) TABS tablet Take 1 tablet by mouth daily.     Melatonin 10 MG TABS Take by mouth.     Multiple Vitamins-Minerals (MULTIVITAMIN ADULTS 50+) TABS Take by mouth.     guaiFENesin-codeine 100-10 MG/5ML syrup Take 5 mLs by mouth 3 (three) times daily as needed for cough. 120 mL 0   HYDROcodone bit-homatropine (HYCODAN) 5-1.5 MG/5ML syrup Take 5 mLs by mouth every 8 (eight) hours as needed for cough. 120 mL 0   No facility-administered medications prior to visit.   No Known Allergies    Objective:   Today's Vitals   11/18/22 0956  BP: 138/82  Pulse: 69  Temp: (!) 97 F (36.1 C)  TempSrc: Temporal  SpO2: 98%  Weight: 130 lb 9.6 oz (59.2 kg)  Height: '5\' 4"'$  (1.626 m)   Body mass index is 22.42 kg/m.  General: Well developed, well nourished. No acute distress. Psych: Alert and oriented. Normal mood and affect.  Health Maintenance Due  Topic Date Due   Medicare Annual Wellness (AWV)  05/20/2022   Imaging: Bone Density (06/10/2021) ASSESSMENT: The BMD measured at AP Spine L1-L2 is 0.875 g/cm2 with a T-score of -2.4. This patient is considered osteopenic according to [facility]) criteria. L-3 & 4 was excluded due to degenerative changes. The scan quality is good.   Site Region Measured Date Measured Age WHO YA BMD Classification T-score AP Spine L1-L2 06/10/2021 80.0 Osteopenia -2.4 0.875 g/cm2   DualFemur Neck Left 5/10/2021 81.0 [age] Osteopenia -2.4  0.702 g/cm2   Left Forearm Radius 33% 06[age]10/2021 80.0 Osteopenia -2.1 0.688 g/cm2    MR Lumbar Spine wo contrast (04/27/2021) IMPRESSION: 1. Multiple lower thoracic and lumbar compression fractures, as detailed above. The L4 fracture appears acute/recent with bone marrow edema that involves the superior endplate and anterior pedicles bilaterally. Mild edema in the adjacent L3-L4 and L4-L5 discs is likely posttraumatic. There is mild bony retropulsion along the superior L4 and inferior L3 endplates with mild resulting canal stenosis at these levels. 2. Superimposed multilevel degenerative change, detailed above and greatest at L5-S1 where there is moderate right foraminal stenosis and mild right subarticular recess stenosis. Disc contacts the exiting right L5 and descending right S1 nerves.  Assessment & Plan:   1. Essential hypertension Reviewed home BP log. It shows an overage BP of 141/84 through November. I discussed with Shelley Snyder that this is consistent with Stage 2 hypertension. Further, she does not appear to have "white coat" issues. I recommend she consider going on a daily diuretic. We also discussed nonpharmacologic approaches to her blood pressure. I will check her renal function and potassium. I will see about getting her to take HCTZ.  - Basic metabolic panel  2. Age-related osteoporosis without current pathological fracture I reviewed the findings of the bone density, which showed osteopenic ranges. However, she also has evidence of multiple spinal compression fractures. I recommend that she continue Prolia to reduce further fracture risk. We also discussed regular low-impact exercise and ways to reduce fall risk.  3. History of compression fracture of spine As above.  4. Vitamin D deficiency I will reassess her Vit. D level.  - VITAMIN D 25 Hydroxy (Vit-D Deficiency, Fractures)  5. Hypercholesterolemia I will reassess her lipids.  - Lipid panel  6. Iron deficiency  anemia, unspecified iron deficiency anemia type I will reassess her blood counts. If normal, we will consider this issue resolved.  - CBC  Return in about 6 months (around 05/20/2023) for Reassessment.   Haydee Salter, MD

## 2022-12-04 ENCOUNTER — Encounter: Payer: Self-pay | Admitting: Family Medicine

## 2022-12-04 ENCOUNTER — Telehealth: Payer: Self-pay

## 2022-12-04 DIAGNOSIS — I10 Essential (primary) hypertension: Secondary | ICD-10-CM

## 2022-12-04 MED ORDER — HYDROCHLOROTHIAZIDE 25 MG PO TABS: 25.0000 mg | ORAL_TABLET | Freq: Every day | ORAL | 3 refills | Status: DC

## 2022-12-04 NOTE — Patient Instructions (Signed)
Visit Information  Thank you for taking time to visit with me today. Please don't hesitate to contact me if I can be of assistance to you.   Following are the goals we discussed today:   Goals Addressed             This Visit's Progress    COMPLETED: Care coordination Activities-No follow up required       Care Coordination Interventions: Advised patient to schedule annual exam Discussed [facility] services and support. Assessed SDOH. Advised to discuss with primary care physician if services needed in the future.            If you are experiencing a Mental Health or [place] or need someone to talk to, please call the Suicide and Crisis Lifeline: [phone]   Patient verbalizes understanding of instructions and care plan provided today and agrees to view in [place]. Active MyChart status and patient understanding of how to access instructions and care plan via MyChart confirmed with patient.     No further follow up required: decline  Jone Baseman, RN, MSN [place] [facility] Care Management Coordinator Direct Line [phone]

## 2022-12-04 NOTE — Patient Outreach (Signed)
  Care Coordination   Initial Visit Note   12/04/2022 Name: Denim Start MRN: [id number] DOB: [birth year]  Dorothy Polhemus is a 81 y.o. year old female who sees Rudd, Lillette Boxer, MD for primary care. I spoke with  [name] by phone today.  What matters to the patients health and wellness today?  none    Goals Addressed             This Visit's Progress    COMPLETED: Care coordination Activities-No follow up required       Care Coordination Interventions: Advised patient to schedule annual exam Discussed [facility] services and support. Assessed SDOH. Advised to discuss with primary care physician if services needed in the future.          SDOH assessments and interventions completed:  Yes  SDOH Interventions Today    Flowsheet Row Most Recent Value  SDOH Interventions   Housing Interventions Intervention Not Indicated  Transportation Interventions Intervention Not Indicated        Care Coordination Interventions:  Yes, provided   Follow up plan: No further intervention required.   Encounter Outcome:  Pt. Visit Completed   Jone Baseman, RN, MSN [place] [facility] Care Management Coordinator Direct Line [phone]

## 2022-12-21 DIAGNOSIS — S0502XA Injury of conjunctiva and corneal abrasion without foreign body, left eye, initial encounter: Secondary | ICD-10-CM | POA: Diagnosis not present

## 2022-12-22 DIAGNOSIS — S0502XD Injury of conjunctiva and corneal abrasion without foreign body, left eye, subsequent encounter: Secondary | ICD-10-CM | POA: Diagnosis not present

## 2022-12-25 DIAGNOSIS — S0502XD Injury of conjunctiva and corneal abrasion without foreign body, left eye, subsequent encounter: Secondary | ICD-10-CM | POA: Diagnosis not present

## 2023-01-21 ENCOUNTER — Other Ambulatory Visit: Payer: Self-pay | Admitting: Family Medicine

## 2023-01-22 ENCOUNTER — Other Ambulatory Visit: Payer: Self-pay

## 2023-01-22 NOTE — Telephone Encounter (Signed)
It's time for my 6 month Prolia shot.  Can you please let me know what the cost is this year under the new [employer] guidelines and when it can be available?

## 2023-02-01 ENCOUNTER — Encounter: Payer: Self-pay | Admitting: Family Medicine

## 2023-02-02 NOTE — Telephone Encounter (Signed)
Patient needing PA for Prolia injection last injection 08/19/22

## 2023-02-04 NOTE — Telephone Encounter (Signed)
PA expires 08/01/23

## 2023-02-04 NOTE — Telephone Encounter (Signed)
Prolia VOB initiated via [email or web address] 

## 2023-02-05 ENCOUNTER — Other Ambulatory Visit: Payer: Self-pay

## 2023-02-05 ENCOUNTER — Other Ambulatory Visit (HOSPITAL_COMMUNITY): Payer: Self-pay

## 2023-02-05 MED ORDER — DENOSUMAB 60 MG/ML ~~LOC~~ SOSY
60.0000 mg | PREFILLED_SYRINGE | SUBCUTANEOUS | 1 refills | Status: DC
  Filled 2023-02-05 (×2): qty 1, 180d supply, fill #0
  Filled 2023-07-20: qty 1, 180d supply, fill #1

## 2023-02-05 NOTE — Telephone Encounter (Signed)
Refill request for pending Rx to be sent to [facility]. Okay to send in refill? Last Prolia injection 08/19/22

## 2023-02-05 NOTE — Telephone Encounter (Signed)
Pt ready for scheduling for Prolia on or after : 02/05/23  Out-of-pocket cost due at time of visit: $100 (through pharmacy)  Primary: [employer] Prolia co-insurance: 20% Admin fee co-insurance: 20%  Secondary: --- Prolia co-insurance:  Admin fee co-insurance:   Medical Benefit Details: Date Benefits were checked: 02/04/23 Deductible: NO/ Coinsurance: 20%/ Admin Fee: 20%  Prior Auth: APPROVED PA# Expiration Date: 08/01/23   Pharmacy benefit: Copay $100 If patient wants fill through the pharmacy benefit please send prescription to: [employer], and include estimated need by date in rx notes. Pharmacy will ship medication directly to the office.  Patient NOT eligible for Prolia Copay Card. Copay Card can make patient's cost as little as $25. Link to apply: [email or web address]://[email or web address]  ** This summary of benefits is an estimation of the patient's out-of-pocket cost. Exact cost may very based on individual plan coverage.

## 2023-02-05 NOTE — Addendum Note (Signed)
Addended by: Lynda Rainwater on: 02/05/2023 11:23 AM   Modules accepted: Orders

## 2023-02-05 NOTE — Telephone Encounter (Signed)
Spoke with patient who verbally understood Rx sent to Shelley Snyder and someone will give her a call for $100 payment and then mail to office patient states that she will call back to schedule appointment to come in for injection $25 co-pay due at time of visit. Patient aware and will pay at that time.

## 2023-02-08 ENCOUNTER — Other Ambulatory Visit: Payer: Self-pay

## 2023-02-10 ENCOUNTER — Ambulatory Visit (INDEPENDENT_AMBULATORY_CARE_PROVIDER_SITE_OTHER): Payer: Medicare HMO

## 2023-02-10 DIAGNOSIS — M810 Age-related osteoporosis without current pathological fracture: Secondary | ICD-10-CM | POA: Diagnosis not present

## 2023-02-10 MED ORDER — DENOSUMAB 60 MG/ML ~~LOC~~ SOSY
60.0000 mg | PREFILLED_SYRINGE | Freq: Once | SUBCUTANEOUS | Status: AC
  Administered 2023-02-10: 60 mg via SUBCUTANEOUS

## 2023-02-10 NOTE — Progress Notes (Signed)
After obtaining consent, and per orders of Dr. Gena Fray, injection of Prolia given by Lynda Rainwater. Patient instructed to remain in clinic for 20 minutes afterwards, and to report any adverse reaction to me immediately.

## 2023-04-15 ENCOUNTER — Encounter: Payer: Self-pay | Admitting: Family Medicine

## 2023-04-15 DIAGNOSIS — Z1231 Encounter for screening mammogram for malignant neoplasm of breast: Secondary | ICD-10-CM

## 2023-04-15 DIAGNOSIS — M810 Age-related osteoporosis without current pathological fracture: Secondary | ICD-10-CM

## 2023-04-19 DIAGNOSIS — L820 Inflamed seborrheic keratosis: Secondary | ICD-10-CM | POA: Diagnosis not present

## 2023-06-21 DIAGNOSIS — Z008 Encounter for other general examination: Secondary | ICD-10-CM | POA: Diagnosis not present

## 2023-06-21 DIAGNOSIS — M1990 Unspecified osteoarthritis, unspecified site: Secondary | ICD-10-CM | POA: Diagnosis not present

## 2023-06-21 DIAGNOSIS — M810 Age-related osteoporosis without current pathological fracture: Secondary | ICD-10-CM | POA: Diagnosis not present

## 2023-06-21 DIAGNOSIS — Z79620 Long term (current) use of immunosuppressive biologic: Secondary | ICD-10-CM | POA: Diagnosis not present

## 2023-06-21 DIAGNOSIS — Z809 Family history of malignant neoplasm, unspecified: Secondary | ICD-10-CM | POA: Diagnosis not present

## 2023-06-21 DIAGNOSIS — Z8249 Family history of ischemic heart disease and other diseases of the circulatory system: Secondary | ICD-10-CM | POA: Diagnosis not present

## 2023-06-21 DIAGNOSIS — Z87891 Personal history of nicotine dependence: Secondary | ICD-10-CM | POA: Diagnosis not present

## 2023-06-21 DIAGNOSIS — I10 Essential (primary) hypertension: Secondary | ICD-10-CM | POA: Diagnosis not present

## 2023-06-23 ENCOUNTER — Ambulatory Visit (HOSPITAL_BASED_OUTPATIENT_CLINIC_OR_DEPARTMENT_OTHER)
Admission: RE | Admit: 2023-06-23 | Discharge: 2023-06-23 | Disposition: A | Discharge status: Home / Self Care | Payer: Medicare HMO | Source: Ambulatory Visit | Attending: Family Medicine | Admitting: Family Medicine

## 2023-06-23 ENCOUNTER — Encounter: Payer: Self-pay | Admitting: Family Medicine

## 2023-06-23 ENCOUNTER — Encounter (HOSPITAL_BASED_OUTPATIENT_CLINIC_OR_DEPARTMENT_OTHER): Payer: Self-pay

## 2023-06-23 ENCOUNTER — Ambulatory Visit (HOSPITAL_BASED_OUTPATIENT_CLINIC_OR_DEPARTMENT_OTHER): Admission: RE | Admit: 2023-06-23 | Payer: Medicare HMO | Source: Ambulatory Visit

## 2023-06-23 DIAGNOSIS — Z780 Asymptomatic menopausal state: Secondary | ICD-10-CM | POA: Diagnosis not present

## 2023-06-23 DIAGNOSIS — Z1231 Encounter for screening mammogram for malignant neoplasm of breast: Secondary | ICD-10-CM | POA: Diagnosis not present

## 2023-06-23 DIAGNOSIS — M810 Age-related osteoporosis without current pathological fracture: Secondary | ICD-10-CM | POA: Diagnosis not present

## 2023-06-23 DIAGNOSIS — M8589 Other specified disorders of bone density and structure, multiple sites: Secondary | ICD-10-CM | POA: Diagnosis not present

## 2023-07-07 DIAGNOSIS — N993 Prolapse of vaginal vault after hysterectomy: Secondary | ICD-10-CM | POA: Diagnosis not present

## 2023-07-07 DIAGNOSIS — N8110 Cystocele, unspecified: Secondary | ICD-10-CM | POA: Diagnosis not present

## 2023-07-08 ENCOUNTER — Telehealth: Payer: Self-pay

## 2023-07-08 ENCOUNTER — Ambulatory Visit (INDEPENDENT_AMBULATORY_CARE_PROVIDER_SITE_OTHER): Payer: Medicare HMO | Admitting: Family Medicine

## 2023-07-08 ENCOUNTER — Other Ambulatory Visit (HOSPITAL_COMMUNITY): Payer: Self-pay

## 2023-07-08 VITALS — BP 122/74 | HR 69 | Temp 98.5°F | Ht 64.0 in | Wt 130.6 lb

## 2023-07-08 DIAGNOSIS — I10 Essential (primary) hypertension: Secondary | ICD-10-CM

## 2023-07-08 DIAGNOSIS — M810 Age-related osteoporosis without current pathological fracture: Secondary | ICD-10-CM | POA: Diagnosis not present

## 2023-07-08 DIAGNOSIS — N8110 Cystocele, unspecified: Secondary | ICD-10-CM | POA: Insufficient documentation

## 2023-07-08 DIAGNOSIS — N993 Prolapse of vaginal vault after hysterectomy: Secondary | ICD-10-CM

## 2023-07-08 NOTE — Assessment & Plan Note (Signed)
Follow up with urogynecology concerning surgery.

## 2023-07-08 NOTE — Assessment & Plan Note (Signed)
Blood pressure is in good control. I recommend she continue hydrochlorothiazide 25 mg daily, as it is managing her blood pressures well.

## 2023-07-08 NOTE — Telephone Encounter (Signed)
Prolia VOB initiated via [email or web address]  Last Prolia inj: 02/10/23 Next Prolia inj DUE:  08/13/23

## 2023-07-08 NOTE — Progress Notes (Signed)
[facility]ARE [facility] [address] [place] [address] Dept: [phone] Dept Fax: [phone]  Chronic Care Office Visit  Subjective:    Patient ID: Shelley Snyder, female    DOB: 01/27/1941, 82 y.o..   MRN: [id number]  Chief Complaint  Patient presents with   Hypertension    6 month f/u HTN.   No concerns.     History of Present Illness:  Patient is in today for reassessment of chronic medical issues.  Ms. Sloma has a history of osteoporosis. She is currently managed on Prolia and takes daily calcium and Vit D supplements. She was previously on zoledronic acid (Reclast). She has a past history of a spinal compression fractures.    Ms. Acob has Stage 2 hypertension, though for many years she had felt this was more of a "white coat hypertension" issue. Review of her home blood pressures had helped to confirm the essential hypertension issue. She is now managed on hydrochlorothiazide 25 mg daily. She brought in a log of blood pressures today. In jan. her 7-day average was 134/84 with a maximum BP of 150/95 in mid-February. After April 1, her blood pressures have been improved. Her most recent 7-day average is 116/73. 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  Ms. 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 She has had a procedure to correct a rectocele. However, now she has a significant cystocele. Her urologist is referring her to a urogynecologist to consider an anterior repair.  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 Up to 2 BID.     Ascorbic Acid (VITAMIN C) 1000 MG tablet Take 1,000 mg by mouth daily.     B Complex-Biotin-FA (SUPER B-50 COMPLEX) CAPS      Calcium Carb-Cholecalciferol 600-800 MG-UNIT TABS Take 2 tablets by mouth daily.     Cholecalciferol (VITAMIN D-3 PO) Take by mouth.     denosumab (PROLIA) 60 MG/ML SOSY injection Inject 60 mg into the skin every 6 (six) months. 1 mL 1   hydrochlorothiazide (HYDRODIURIL) 25 MG tablet Take 1 tablet (25 mg total) by mouth daily. 90 tablet 3   lactobacillus acidophilus (BACID) TABS tablet Take 1 tablet by mouth daily.     Magnesium 500 MG CAPS      Melatonin 10 MG TABS Take by mouth.  Multiple Vitamins-Minerals (MULTIVITAMIN ADULTS 50+) TABS Take by mouth.     No facility-administered medications prior to visit.   No Known Allergies Objective:   Today's Vitals   07/08/23 0836  BP: 122/74  Pulse: 69  Temp: 98.5 F (36.9 C)  TempSrc: Temporal  SpO2: 99%  Weight: 130 lb 9.6 oz (59.2 kg)  Height: 5\' 4"  (1.626 m)   Body mass index is 22.42 kg/m.   General: Well developed, well nourished. No acute distress. CV: RRR without murmurs or rubs. Pulses 2+ bilaterally. Psych: Alert and oriented. Normal mood and affect.  Health Maintenance Due  Topic Date Due   Medicare Annual Wellness (AWV)  05/20/2022    INFLUENZA VACCINE  07/08/2023     Imaging: Bone Density (06/23/2023) ASSESSMENT: The BMD measured at Femur Neck Left is 0.717 g/cm2 with a T-score of -2.3. This patient is considered osteopenic according to World Health Organization [facility]) criteria. Compared with the prior study on, 06/10/2021 the BMD of the total mean shows no statistically significant change. L-3 and L-4 were excluded due to degenerative changes. FRAX not done due to current Prolia use. The scan quality is good.   Site Region Measured Date Measured Age WHO YA BMD Classification T-score AP Spine L1-L2 06/23/2022 83.0 Low Bone Mass -2.1 0.910 g/cm2 AP Spine L1-L2 06/10/2021 81.0 Low Bone Mass -2.4 0.875 g/cm2   DualFemur Neck Left 06/23/2023 83.0 Low Bone Mass -2.3 0.717 g/cm2 DualFemur Neck Left 06/10/2021 81.0 Low Bone Mass -2.4 0.702 g/cm2   DualFemur Neck Right 06/23/2023 83.0 Low Bone Mass -2.3 0.721 g/cm2 DualFemur Neck Right 06/10/2021 81.0 Low Bone Mass -2.3 0.716 g/cm2   DualFemur Total Mean 06/23/2023 83.0 Low Bone Mass -2.1 0.737 g/cm2 DualFemur Total Mean 06/10/2021 81.0 Low Bone Mass -2.3 0.724 g/cm2   Left Forearm Radius 33% 06/23/2023 83.0 Low Bone Mass -1.8 0.721 g/cm2 Left Forearm Radius 33% 06/10/2021 81.0 Low Bone Mass -2.1 0.688 g/cm2  Assessment & Plan:   Problem List Items Addressed This Visit       Cardiovascular and Mediastinum   Essential hypertension - Primary    Blood pressure is in good control. I recommend she continue hydrochlorothiazide 25 mg daily, as it is managing her blood pressures well.        Musculoskeletal and Integument   Age-related osteoporosis without current pathological fracture    Bone density is stable. She should continue calcium with Vit. D supplementation. We will continue her on Prolia every 6 months.        Genitourinary   Vaginal vault prolapse after hysterectomy    Follow up with urogynecology concerning surgery.       Return in about 5 months  (around 11/22/2023).   Loyola Mast, MD

## 2023-07-08 NOTE — Assessment & Plan Note (Signed)
Bone density is stable. She should continue calcium with Vit. D supplementation. We will continue her on Prolia every 6 months.

## 2023-07-09 ENCOUNTER — Other Ambulatory Visit (HOSPITAL_COMMUNITY): Payer: Self-pay

## 2023-07-09 NOTE — Telephone Encounter (Signed)
Pharmacy Patient Advocate Encounter  Received notification from [facility] that Prior Authorization for Prolia has been APPROVED from 08/02/23 to 08/01/24. Ran test claim, Copay is $- is a refill too soon, can be filled on or after 08/04/23  Case ID#: [id number] AUTH# [id number]  Approval letter indexed to chart

## 2023-07-09 NOTE — Telephone Encounter (Signed)
Pharmacy Patient Advocate Encounter  Received notification from [employer] that Prior Authorization for PROLIA has been APPROVED from 08/02/23 to 08/01/24. Ran test claim, Copay is $--- (Next fill date 08/04/23)  PA #/Case ID/Reference #: [id number]

## 2023-07-09 NOTE — Telephone Encounter (Signed)
Pt ready for scheduling for Prolia on or after : 08/13/23  Out-of-pocket cost due at time of visit: $327  Primary: [employer] - Medicare Prolia co-insurance: 20% Admin fee co-insurance: 20%  Secondary: N/A Prolia co-insurance:  Admin fee co-insurance:   Medical Benefit Details: Date Benefits were checked: 07/08/23 Deductible: no/ Coinsurance: 20%/ Admin Fee: 20%  Prior Auth: APPROVED PA# [id number]  Expiration Date: 08/01/2024  # of doses approved:  Pharmacy benefit: Copay $ -- (refill too soon can fill on or after 08/04/23) If patient wants fill through the pharmacy benefit please send prescription to:  [facility] , and include estimated need by date in rx notes. Pharmacy will ship medication directly to the office.  Patient not eligible for Prolia Copay Card. Copay Card can make patient's cost as little as $25. Link to apply: [email or web address]://[email or web address].[email or web address]  ** This summary of benefits is an estimation of the patient's out-of-pocket cost. Exact cost may very based on individual plan coverage.

## 2023-07-09 NOTE — Telephone Encounter (Signed)
See other messages. Dm/cma

## 2023-07-20 ENCOUNTER — Other Ambulatory Visit (HOSPITAL_COMMUNITY): Payer: Self-pay

## 2023-07-20 NOTE — Telephone Encounter (Signed)
Hello!  Patient is able to fill through her pharmacy benefit on or after 08/04/23, unable to confirm copay at this time due to it's refill too soon. When filling through the pharmacy benefit, the cost of the drug itself is billed and paid at the pharmacy, so only administration charges are billed at the office visit. Most [facility] retail pharmacies do not administer Prolia, but if she can find one that has a minute clinic they may be able to. She would need to call to confirm. If she fills through our [facility], the pharmacy ships medication directly to the MD office for administration.   Hope this helps explain the pharmacy process.  Thanks!

## 2023-07-21 NOTE — Telephone Encounter (Signed)
Relayed message below to patient. Pt reports receiving a call from [facility] providing the copay amount due at the time of appt for Prolia. Pt will call the office when she has her calendar available to schedule a NV after 08/10/23.

## 2023-07-27 ENCOUNTER — Encounter: Payer: Self-pay | Admitting: Family Medicine

## 2023-07-29 ENCOUNTER — Encounter: Payer: Self-pay | Admitting: Family Medicine

## 2023-07-29 ENCOUNTER — Ambulatory Visit (INDEPENDENT_AMBULATORY_CARE_PROVIDER_SITE_OTHER): Payer: Medicare HMO | Admitting: Family Medicine

## 2023-07-29 VITALS — BP 124/80 | HR 55 | Temp 98.4°F | Ht 64.0 in | Wt 131.6 lb

## 2023-07-29 DIAGNOSIS — I10 Essential (primary) hypertension: Secondary | ICD-10-CM | POA: Diagnosis not present

## 2023-07-29 DIAGNOSIS — N760 Acute vaginitis: Secondary | ICD-10-CM

## 2023-07-29 MED ORDER — METRONIDAZOLE 500 MG PO TABS: 500.0000 mg | ORAL_TABLET | Freq: Three times a day (TID) | ORAL | 0 refills | Status: AC

## 2023-07-29 NOTE — Addendum Note (Signed)
Addended by: Loyola Mast on: 07/29/2023 09:49 AM   Modules accepted: Level of Service

## 2023-07-29 NOTE — Progress Notes (Addendum)
[facility]IMARY CARE [facility] [address] [place] [address] Dept: [phone] Dept Fax: [phone]  Office Visit  Subjective:    Patient ID: Shelley Snyder, female    DOB: 02/25/1941, 83 y.o..   MRN: [id number]  Chief Complaint  Patient presents with   Vaginal Discharge    Pt c/o vaginal discharge,it has gotten better it was green in color/mucous and denies sexual activity she sees OBGYN next month.   History of Present Illness:  Patient is in today complaining of a recent issue with a vaginal discharge. Shelley Snyder has a  history of a cystocele and vaginal vault prolapse. She uses a pessary, but is also undergoing evaluation for surgery. She recently had her pessary out for a procedure. Although she boiled this before replacing it, over the next few days, she developed a green-tinged vaginal discharge, which did have an odor to it. She denies any vaginal itching.  Shelley Snyder has Stage 2 hypertension, though for many years she had felt this was more of a "white coat hypertension" issue. Review of her home blood pressures had helped to confirm the essential hypertension issue. She is now managed on hydrochlorothiazide 25 mg daily. She brought in a log of blood pressures today. Her most recent 7-day average is 114/74.   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 Up to 2 BID.     Ascorbic Acid (VITAMIN C) 1000 MG tablet Take 1,000 mg by mouth daily.     B Complex-Biotin-FA (SUPER B-50 COMPLEX) CAPS      Calcium Carb-Cholecalciferol 600-800 MG-UNIT TABS Take 2 tablets by mouth daily.     Cholecalciferol (VITAMIN D-3 PO) Take by mouth.     denosumab (PROLIA) 60 MG/ML SOSY injection Inject 60 mg into the skin every 6 (six) months. 1 mL 1   hydrochlorothiazide (HYDRODIURIL) 25 MG tablet Take 1 tablet (25 mg total) by mouth daily. 90 tablet 3   lactobacillus acidophilus (BACID) TABS tablet Take 1 tablet by mouth daily.     Magnesium 500 MG CAPS      Melatonin 10 MG TABS Take by mouth.     Multiple Vitamins-Minerals (MULTIVITAMIN ADULTS 50+) TABS Take by mouth.     No facility-administered medications prior to visit.   No Known Allergies   Objective:   Today's Vitals   07/29/23 0923  BP: 124/80  Pulse: (!) 55  Temp:  98.4 F (36.9 C)  SpO2: 99%  Weight: 131 lb 9.6 oz (59.7 kg)  Height: 5\' 4"  (1.626 m)   Body mass index is 22.59 kg/m.   General: Well developed, well nourished. No acute distress. Psych: Alert and oriented. Normal mood and affect.  Health Maintenance Due  Topic Date Due   Medicare Annual Wellness (AWV)  05/20/2022   COVID-19 Vaccine (6 - 2023-24 season) 12/01/2022   INFLUENZA VACCINE  07/08/2023     Assessment & Plan:   Problem List Items Addressed This Visit   None Visit Diagnoses     Acute vaginitis    -  Primary   Clinically, this sounds c/w a bacterial vaginitis. I will go ahead and treat with a course of  Flagyl. If not improving, she should follow-up with her GYN.   Relevant Medications   metroNIDAZOLE (FLAGYL) 500 MG tablet       Return if symptoms worsen or fail to improve.   Loyola Mast, MD

## 2023-07-29 NOTE — Assessment & Plan Note (Signed)
Blood pressure is in good control. Continue hydrochlorothiazide 25 mg daily.

## 2023-08-03 ENCOUNTER — Other Ambulatory Visit (HOSPITAL_COMMUNITY): Payer: Self-pay

## 2023-08-04 ENCOUNTER — Other Ambulatory Visit (HOSPITAL_COMMUNITY): Payer: Self-pay

## 2023-08-04 ENCOUNTER — Other Ambulatory Visit: Payer: Self-pay

## 2023-08-10 NOTE — Telephone Encounter (Signed)
Medication arrived last week.  Need to call and make an appointment after 08/13/23.  Dm/cma

## 2023-08-11 ENCOUNTER — Other Ambulatory Visit (HOSPITAL_COMMUNITY): Payer: Self-pay

## 2023-08-12 NOTE — Telephone Encounter (Signed)
Patient scheduled 08/17/23 for Nurse visit. Dm/cma

## 2023-08-12 NOTE — Telephone Encounter (Addendum)
Pt has already contacted ins company and paid $100 copay. $30 admin fee due at check in on 08/17/23.

## 2023-08-17 ENCOUNTER — Ambulatory Visit (INDEPENDENT_AMBULATORY_CARE_PROVIDER_SITE_OTHER): Payer: Medicare HMO

## 2023-08-17 DIAGNOSIS — E559 Vitamin D deficiency, unspecified: Secondary | ICD-10-CM

## 2023-08-17 DIAGNOSIS — M810 Age-related osteoporosis without current pathological fracture: Secondary | ICD-10-CM

## 2023-08-17 MED ORDER — DENOSUMAB 60 MG/ML ~~LOC~~ SOSY
60.0000 mg | PREFILLED_SYRINGE | Freq: Once | SUBCUTANEOUS | Status: AC
  Administered 2023-08-17: 60 mg via SUBCUTANEOUS

## 2023-08-17 NOTE — Progress Notes (Signed)
Per order of Dr. Veto Kemps patient received Prolia injection 60 mg/ml in Left arm. Patient tolerated well.

## 2023-08-19 DIAGNOSIS — N819 Female genital prolapse, unspecified: Secondary | ICD-10-CM | POA: Diagnosis not present

## 2023-08-19 DIAGNOSIS — Z1330 Encounter for screening examination for mental health and behavioral disorders, unspecified: Secondary | ICD-10-CM | POA: Diagnosis not present

## 2023-08-19 DIAGNOSIS — N993 Prolapse of vaginal vault after hysterectomy: Secondary | ICD-10-CM | POA: Diagnosis not present

## 2023-08-19 DIAGNOSIS — R143 Flatulence: Secondary | ICD-10-CM | POA: Diagnosis not present

## 2023-08-19 DIAGNOSIS — N8110 Cystocele, unspecified: Secondary | ICD-10-CM | POA: Diagnosis not present

## 2023-08-27 ENCOUNTER — Encounter: Payer: Self-pay | Admitting: Family Medicine

## 2023-08-28 ENCOUNTER — Encounter (HOSPITAL_COMMUNITY): Payer: Self-pay

## 2023-09-08 ENCOUNTER — Other Ambulatory Visit (HOSPITAL_COMMUNITY): Payer: Self-pay

## 2023-09-08 ENCOUNTER — Other Ambulatory Visit: Payer: Self-pay

## 2023-09-21 ENCOUNTER — Encounter: Payer: Self-pay | Admitting: Family Medicine

## 2023-09-21 ENCOUNTER — Ambulatory Visit: Payer: Medicare HMO | Admitting: Family Medicine

## 2023-09-21 VITALS — BP 130/76 | HR 67 | Temp 97.0°F | Ht 64.0 in | Wt 133.6 lb

## 2023-09-21 DIAGNOSIS — J301 Allergic rhinitis due to pollen: Secondary | ICD-10-CM | POA: Diagnosis not present

## 2023-09-21 NOTE — Progress Notes (Signed)
[facility]ARE [facility] [address] [place] [address] Dept: [phone] Dept Fax: [phone]  Office Visit  Subjective:    Patient ID: Shelley Snyder, female    DOB: 28-Sep-1941, 82 y.o..   MRN: [id number]  Chief Complaint  Patient presents with   Cough    C/o having cough, sinus drainage x 2 weeks.  Has taken Allegra    History of Present Illness:  Patient is in today for evaluation of a cough. She notes she has the cough chronically. It has been a little increased over the past 2 weeks. She had been to [facility] to visit family 2 weeks ago. [name] admits to some intermittent nasal congestion and rhinorrhea. She has a history of a hiatal hernia, but has not noted any significant heart burn. She is scheduled for surgery due to her vaginal prolapse in 2 weeks. She was asked to have her cough evaluated.  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 Up to 2 BID.     Ascorbic Acid (VITAMIN C) 1000 MG tablet Take 1,000 mg by mouth daily.     B Complex-Biotin-FA (SUPER B-50 COMPLEX) CAPS      Calcium Carb-Cholecalciferol 600-800 MG-UNIT TABS Take 2 tablets by mouth daily.     Cholecalciferol (VITAMIN D-3 PO) Take by mouth.     denosumab (PROLIA) 60 MG/ML SOSY injection Inject 60 mg into the skin every 6 (six) months. 1 mL 1   fexofenadine (ALLEGRA) 180 MG tablet Take 180 mg by mouth daily.     hydrochlorothiazide (HYDRODIURIL) 25 MG tablet Take 1 tablet (25 mg total) by mouth daily. 90 tablet 3   lactobacillus acidophilus (BACID) TABS tablet Take 1 tablet by mouth daily.     Magnesium 500 MG CAPS      Melatonin 10 MG TABS Take by mouth.     Multiple Vitamins-Minerals (MULTIVITAMIN ADULTS 50+) TABS Take by mouth.     No facility-administered medications prior to visit.   No Known Allergies   Objective:   Today's Vitals   09/21/23 1034  BP: 130/76  Pulse: 67  Temp: (!) 97 F (36.1 C)  TempSrc: Temporal  SpO2: 100%  Weight: 133 lb 9.6 oz (60.6 kg)  Height: 5\' 4"  (1.626 m)   Body mass index is 22.93 kg/m.   General: Well developed, well nourished.  No acute distress. HEENT: Normocephalic, non-traumatic. PERRL, EOMI. Conjunctiva clear. External ears normal. EAC and TMs normal   bilaterally. Nose with swollen, pale turbinates and moderate congestion and scant rhinorrhea. Mucous membranes moist.   There is mild to moderate mucous streaking of the posterior oropharynx. Good dentition. Neck: Supple. No lymphadenopathy. No thyromegaly. Lungs: Clear to auscultation bilaterally. No wheezing, rales or rhonchi. Psych: Alert and oriented. Normal mood and affect.  Health Maintenance Due  Topic Date Due   Medicare Annual Wellness (AWV)  05/20/2022     Assessment & Plan:   Problem List Items Addressed This Visit       Respiratory   Allergic rhinitis due to pollen - Primary    The  cough is likely due to allergic rhinitis with post-nasal drip. I recommend she consider starting a nasal steroid spray (Flonase or Nasonex) daily.       Return if symptoms worsen or fail to improve.   Loyola Mast, MD

## 2023-09-21 NOTE — Assessment & Plan Note (Signed)
The cough is likely due to allergic rhinitis with post-nasal drip. I recommend she consider starting a nasal steroid spray (Flonase or Nasonex) daily.

## 2023-09-21 NOTE — Patient Instructions (Signed)

## 2023-09-28 DIAGNOSIS — N819 Female genital prolapse, unspecified: Secondary | ICD-10-CM | POA: Diagnosis not present

## 2023-09-28 DIAGNOSIS — Z79899 Other long term (current) drug therapy: Secondary | ICD-10-CM | POA: Diagnosis not present

## 2023-09-28 DIAGNOSIS — Z01812 Encounter for preprocedural laboratory examination: Secondary | ICD-10-CM | POA: Diagnosis not present

## 2023-09-28 DIAGNOSIS — N816 Rectocele: Secondary | ICD-10-CM | POA: Diagnosis not present

## 2023-09-28 DIAGNOSIS — N8110 Cystocele, unspecified: Secondary | ICD-10-CM | POA: Diagnosis not present

## 2023-10-11 DIAGNOSIS — N838 Other noninflammatory disorders of ovary, fallopian tube and broad ligament: Secondary | ICD-10-CM | POA: Diagnosis not present

## 2023-10-11 DIAGNOSIS — N8189 Other female genital prolapse: Secondary | ICD-10-CM | POA: Diagnosis not present

## 2023-10-11 DIAGNOSIS — N736 Female pelvic peritoneal adhesions (postinfective): Secondary | ICD-10-CM | POA: Diagnosis not present

## 2023-10-11 DIAGNOSIS — N819 Female genital prolapse, unspecified: Secondary | ICD-10-CM | POA: Diagnosis not present

## 2023-10-11 DIAGNOSIS — I129 Hypertensive chronic kidney disease with stage 1 through stage 4 chronic kidney disease, or unspecified chronic kidney disease: Secondary | ICD-10-CM | POA: Diagnosis not present

## 2023-10-11 DIAGNOSIS — N8110 Cystocele, unspecified: Secondary | ICD-10-CM | POA: Diagnosis not present

## 2023-10-11 DIAGNOSIS — N816 Rectocele: Secondary | ICD-10-CM | POA: Diagnosis not present

## 2023-10-11 DIAGNOSIS — N83312 Acquired atrophy of left ovary: Secondary | ICD-10-CM | POA: Diagnosis not present

## 2023-10-11 DIAGNOSIS — N189 Chronic kidney disease, unspecified: Secondary | ICD-10-CM | POA: Diagnosis not present

## 2023-10-11 DIAGNOSIS — N993 Prolapse of vaginal vault after hysterectomy: Secondary | ICD-10-CM | POA: Diagnosis not present

## 2023-10-11 DIAGNOSIS — N83311 Acquired atrophy of right ovary: Secondary | ICD-10-CM | POA: Diagnosis not present

## 2023-10-11 DIAGNOSIS — N9489 Other specified conditions associated with female genital organs and menstrual cycle: Secondary | ICD-10-CM | POA: Diagnosis not present

## 2023-10-14 ENCOUNTER — Encounter: Payer: Self-pay | Admitting: Family Medicine

## 2023-10-27 DIAGNOSIS — R8281 Pyuria: Secondary | ICD-10-CM | POA: Diagnosis not present

## 2023-10-27 DIAGNOSIS — N819 Female genital prolapse, unspecified: Secondary | ICD-10-CM | POA: Diagnosis not present

## 2023-11-18 ENCOUNTER — Ambulatory Visit: Payer: Medicare HMO | Admitting: Family Medicine

## 2023-11-18 ENCOUNTER — Encounter: Payer: Self-pay | Admitting: Family Medicine

## 2023-11-18 VITALS — BP 120/68 | HR 63 | Temp 98.5°F | Resp 100 | Ht 64.0 in | Wt 131.4 lb

## 2023-11-18 DIAGNOSIS — I10 Essential (primary) hypertension: Secondary | ICD-10-CM | POA: Diagnosis not present

## 2023-11-18 DIAGNOSIS — Z0000 Encounter for general adult medical examination without abnormal findings: Secondary | ICD-10-CM

## 2023-11-18 DIAGNOSIS — M810 Age-related osteoporosis without current pathological fracture: Secondary | ICD-10-CM | POA: Diagnosis not present

## 2023-11-18 DIAGNOSIS — E785 Hyperlipidemia, unspecified: Secondary | ICD-10-CM | POA: Diagnosis not present

## 2023-11-18 LAB — LIPID PANEL
Cholesterol: 214 mg/dL — ABNORMAL HIGH (ref 0–200)
HDL: 82.3 mg/dL (ref >39.00)
LDL Cholesterol: 116 mg/dL — ABNORMAL HIGH (ref 0–99)
NonHDL: 131.33
Total CHOL/HDL Ratio: 3
Triglycerides: 76 mg/dL (ref 0.0–149.0)
VLDL: 15.2 mg/dL (ref 0.0–40.0)

## 2023-11-18 MED ORDER — DENOSUMAB 60 MG/ML ~~LOC~~ SOSY: 60.0000 mg | PREFILLED_SYRINGE | Freq: Once | SUBCUTANEOUS | Status: DC

## 2023-11-18 MED ORDER — HYDROCHLOROTHIAZIDE 25 MG PO TABS: 25.0000 mg | ORAL_TABLET | Freq: Every day | ORAL | 3 refills | Status: DC

## 2023-11-18 NOTE — Assessment & Plan Note (Signed)
Bone density is stable. She should continue calcium with Vit. D supplementation. We will continue her on denosumab (Prolia) every 6 months. Her next injection is due 02/14/2024. We discussed the timing for her dental procedure based on best guidance to reduce risk of complications related to her Prolia use.

## 2023-11-18 NOTE — Addendum Note (Signed)
Addended by: Larey Dresser on: 11/18/2023 10:02 AM   Modules accepted: Orders

## 2023-11-18 NOTE — Assessment & Plan Note (Signed)
We will check annual lipids today.

## 2023-11-18 NOTE — Assessment & Plan Note (Signed)
Blood pressure is in good control. Continue hydrochlorothiazide 25 mg daily.

## 2023-11-18 NOTE — Progress Notes (Signed)
[facility] PRIMARY CARE [facility] Trecia Rogers [facility] [address] [place] [address] Dept: [phone] Dept Fax: [phone]  Annual Physical Visit  Subjective:    Patient ID: Shelley Snyder, female    DOB: 1940-12-15, 82 y.o..   MRN: [id number]  Chief Complaint  Patient presents with   Annual Exam    CPE/labs  fasting today.      History of Present Illness:  Patient is in today for an annual physical/preventative visit.  Ms. Sides has essential hypertension. She is managed on HCTZ 25 mg daily.    Ms. 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 she recently went through surgery to correct this. She notes that she is improving well. She has not been released to go back to her usual exercise, but anticipates this will happen next week.  Ms. Johny Shock has a history of osteoporosis with a prior compression fracture of the spine. She is managed on denosumab (Prolia) injections every 6 months. She also takes a calcium with Vit. D supplement. She has a pending tooth extraction for which her dentist sought my guidance on timing. I had advised that she have this done wihtin the month prior to her next Prolia injection (which would be in March).  Review of Systems  Constitutional:  Negative for chills, diaphoresis, fever, malaise/fatigue and weight loss.  HENT:  Positive for congestion. Negative for ear pain, hearing loss, sinus pain, sore throat and tinnitus.        Chronic rhinitis. Notes it is improved on current therapy.   Eyes:  Negative for blurred vision, pain, discharge and redness.  Respiratory:  Negative for cough, shortness of breath and wheezing.   Cardiovascular:  Negative for chest pain and palpitations.  Gastrointestinal:  Negative for abdominal pain, constipation, diarrhea, heartburn, nausea and vomiting.  Musculoskeletal:  Negative for back pain, joint pain and myalgias.  Skin:  Positive for rash. Negative for  itching.       Has a small patch of scaly rash on the upper right abdomen. This is only very occasionally pruritic.  Psychiatric/Behavioral:  Negative for depression. The patient is not nervous/anxious.    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 Up to 2 BID.     Ascorbic Acid (VITAMIN C) 1000 MG tablet Take 1,000 mg by mouth daily.     Calcium Carb-Cholecalciferol 600-800 MG-UNIT TABS Take  2 tablets by mouth daily.     Cholecalciferol (VITAMIN D-3 PO) Take by mouth.     cyanocobalamin (VITAMIN B12) 1000 MCG tablet Take 1,000 mcg by mouth daily.     denosumab (PROLIA) 60 MG/ML SOSY injection Inject 60 mg into the skin every 6 (six) months. 1 mL 1   fexofenadine (ALLEGRA) 180 MG tablet Take 180 mg by mouth daily.     lactobacillus acidophilus (BACID) TABS tablet Take 1 tablet by mouth daily.     Magnesium 500 MG CAPS      Melatonin 10 MG TABS Take by mouth.     Multiple Vitamins-Minerals (MULTIVITAMIN ADULTS 50+) TABS Take by mouth.      hydrochlorothiazide (HYDRODIURIL) 25 MG tablet Take 1 tablet (25 mg total) by mouth daily. 90 tablet 3   B Complex-Biotin-FA (SUPER B-50 COMPLEX) CAPS      No facility-administered medications prior to visit.   No Known Allergies Objective:   Today's Vitals   11/18/23 0918  BP: 120/68  Pulse: 63  Resp: (!) 100  Temp: 98.5 F (36.9 C)  TempSrc: Temporal  Weight: 131 lb 6.4 oz (59.6 kg)  Height: 5\' 4"  (1.626 m)   Body mass index is 22.55 kg/m.   General: Well developed, well nourished. No acute distress. HEENT: Normocephalic, non-traumatic. PERRL, EOMI. Conjunctiva clear. External ears normal. EAC and TMs normal   bilaterally. Nose clear without congestion or rhinorrhea. Mucous membranes moist. Mild mucous streaking of the posterior   oropharynx. Good dentition. Neck: Supple. No lymphadenopathy. No thyromegaly. Lungs: Clear to auscultation bilaterally. No wheezing, rales or rhonchi. CV: RRR without murmurs or rubs. Pulses 2+ bilaterally. Abdomen: Soft, non-tender. Bowel sounds positive, normal pitch and frequency. No hepatosplenomegaly. No rebound or   guarding. Surgical wounds are healing well without sign of infection. Back: Moderate kyphosis of the upper cervicothoracic spine. Extremities: Full ROM. No joint swelling or tenderness. Trace edema of the lower legs. Skin: Warm and dry. There is a 2 cm, ovate, pink patch on the upper right abdomen with mild scaliness. Psych: Alert and oriented. Normal mood and affect.  Health Maintenance Due  Topic Date Due   Medicare Annual Wellness (AWV)  05/20/2022   Lab Results WBC 4.0 - 10.5 thou/mcL 4.2  RBC 3.93 - 5.22 million/mcL 4.00  HGB 11.2 - 15.7 gm/dL 08.6  HCT 57.8 - 46.9 % 38.0  MCV 79.4 - 94.8 fL 95.0 High   MCH 25.6 - 32.2 pg 32.3 High   MCHC 32.2 - 35.5 gm/dL 62.9  Plt Ct 528 - 413 thou/mcL 193  RDW SD 35.1 - 46.3 fL 49.1 High   MPV 9.4 - 12.4 fL 10.5  NRBC% 0.0 - 0.2 /100WBC 0.0  Absolute NRBC  Count 0.00 - 0.01 thou/mcL 0.00   Na 136 - 146 mmol/L 141  Potassium 3.7 - 5.4 mmol/L 3.7  Cl 97 - 108 mmol/L 104  CO2 20 - 32 mmol/L 25  AGAP 7 - 16 mmol/L 12  Glucose 65 - 99 mg/dL 87  BUN 8 - 27 mg/dL 21  Creatinine 2.44 - 0.10 mg/dL 2.72  Ca 8.6 - 53.6 mg/dL 8.8  BUN/CREAT RATIO 64.4 - 26.0 30.0 High   eGFR mL/min/1.35m2 86   Imaging: Bone Density (06/23/2023) ASSESSMENT: The BMD measured at Femur Neck Left is 0.717 g/cm2 with a T-score of -2.3. This patient is considered osteopenic according to World Health Organization [facility]) criteria. Compared with the prior study on, 06/10/2021 the BMD of the total mean shows  no statistically signficant change. L-3 and L-4 were excluded due to degenerative changes. FRAX not done due to current Prolia use. The scan quality is good.   Site Region Measured Date Measured Age WHO YA BMD Classification T-score AP Spine L1-L2 06/23/2023 83.0 Low Bone Mass -2.1 0.910 g/cm2 AP Spine L1-L2 06/10/2021 81.0 Low Bone Mass -2.4 0.875 g/cm2   DualFemur Neck Left 06/23/2023 83.0 Low Bone Mass -2.3 0.717 g/cm2 DualFemur Neck Left 06/10/2021 81.0 Low Bone Mass -2.4 0.702 g/cm2   DualFemur Neck Right 06/23/2023 83.0 Low Bone Mass -2.3 0.721 g/cm2 DualFemur Neck Right 06/10/2021 81.0 Low Bone Mass -2.3 0.716 g/cm2   DualFemur Total Mean 06/23/2023 83.0 Low Bone Mass -2.1 0.737 g/cm2 DualFemur Total Mean 06/10/2021 81.0 Low Bone Mass -2.3 0.724 g/cm2   Left Forearm Radius 33% 06/23/2023 83.0 Low Bone Mass -1.8 0.721 g/cm2 Left Forearm Radius 33% 06/10/2021 81.0 Low Bone Mass -2.1 0.688 g/cm2    Assessment & Plan:   Problem List Items Addressed This Visit       Cardiovascular and Mediastinum   Essential hypertension   Blood pressure is in good control. Continue hydrochlorothiazide 25 mg daily.      Relevant Medications   hydrochlorothiazide (HYDRODIURIL) 25 MG tablet     Musculoskeletal and Integument   Age-related osteoporosis without  current pathological fracture   Bone density is stable. She should continue calcium with Vit. D supplementation. We will continue her on denosumab (Prolia) every 6 months. Her next injection is due 02/14/2024. We discussed the timing for her dental procedure based on best guidance to reduce risk of complications related to her Prolia use.        Other   Borderline hyperlipidemia   We will check annual lipids today.      Relevant Medications   hydrochlorothiazide (HYDRODIURIL) 25 MG tablet   Other Relevant Orders   Lipid panel   Other Visit Diagnoses       Annual physical exam    -  Primary   Overall good health. Reviewed recommended screenings and immunizations.       Return in about 6 months (around 05/18/2024) for Reassessment.   Loyola Mast, MD

## 2024-01-05 DIAGNOSIS — H26493 Other secondary cataract, bilateral: Secondary | ICD-10-CM | POA: Diagnosis not present

## 2024-01-05 DIAGNOSIS — Z135 Encounter for screening for eye and ear disorders: Secondary | ICD-10-CM | POA: Diagnosis not present

## 2024-01-05 DIAGNOSIS — H524 Presbyopia: Secondary | ICD-10-CM | POA: Diagnosis not present

## 2024-01-14 ENCOUNTER — Other Ambulatory Visit: Payer: Self-pay

## 2024-01-14 ENCOUNTER — Other Ambulatory Visit (HOSPITAL_COMMUNITY): Payer: Self-pay

## 2024-02-02 ENCOUNTER — Other Ambulatory Visit: Payer: Self-pay

## 2024-02-02 ENCOUNTER — Other Ambulatory Visit: Payer: Self-pay | Admitting: Family Medicine

## 2024-02-02 MED ORDER — PROLIA 60 MG/ML ~~LOC~~ SOSY
60.0000 mg | PREFILLED_SYRINGE | SUBCUTANEOUS | 1 refills | Status: DC
  Filled 2024-02-02 – 2024-02-11 (×2): qty 1, 180d supply, fill #0

## 2024-02-02 NOTE — Progress Notes (Signed)
 Specialty Pharmacy Refill Coordination Note  Shelley Snyder is a 83 y.o. female contacted today regarding refills of specialty medication(s) Denosumab (PROLIA)   Patient requested Courier to Provider Office   Delivery date: 02/09/24   Verified address: [facility]   [address]   Medication will be filled on 02/08/24. Pending Refill Request.   Patient to call and make an appt for March.

## 2024-02-03 ENCOUNTER — Other Ambulatory Visit (HOSPITAL_COMMUNITY): Payer: Self-pay

## 2024-02-03 ENCOUNTER — Other Ambulatory Visit: Payer: Self-pay

## 2024-02-08 ENCOUNTER — Other Ambulatory Visit: Payer: Self-pay

## 2024-02-09 ENCOUNTER — Other Ambulatory Visit: Payer: Self-pay

## 2024-02-10 ENCOUNTER — Other Ambulatory Visit: Payer: Self-pay

## 2024-02-11 ENCOUNTER — Other Ambulatory Visit: Payer: Self-pay

## 2024-02-11 NOTE — Progress Notes (Signed)
 Patient's copay for Prolia is $645. She does not want to pay that much even with medicare payment plan. Office may be able to do a buy and bill. Provider's team looking into this.

## 2024-02-14 ENCOUNTER — Telehealth: Payer: Self-pay

## 2024-02-14 ENCOUNTER — Encounter: Payer: Self-pay | Admitting: Family Medicine

## 2024-02-14 ENCOUNTER — Other Ambulatory Visit: Payer: Self-pay

## 2024-02-14 NOTE — Telephone Encounter (Signed)
 Copied from CRM [id number]. Topic: Clinical - Medication Question >> Feb 11, 2024  1:54 PM Martinique E wrote: Reason for CRM: Tiffany from [facility] called in regarding patient's denosumab (PROLIA) 60 MG/ML SOSY injection. Tiffany was questioning if this medication can go through the office as a "buy and bill," as with the prescription insurance, the patient would have to pay $649, which the patient did not want to pay that much.

## 2024-02-15 ENCOUNTER — Telehealth: Payer: Self-pay

## 2024-02-15 ENCOUNTER — Other Ambulatory Visit (HOSPITAL_COMMUNITY): Payer: Self-pay

## 2024-02-15 NOTE — Telephone Encounter (Signed)
 Prolia VOB initiated via [email or web address]  Next Prolia inj DUE: 02/15/24

## 2024-02-16 ENCOUNTER — Other Ambulatory Visit: Payer: Self-pay

## 2024-02-17 NOTE — Progress Notes (Signed)
 Office is still doing verification of benefits for patient to see how much it would cost her to do Buy/Bill.

## 2024-02-18 ENCOUNTER — Other Ambulatory Visit (HOSPITAL_COMMUNITY): Payer: Self-pay

## 2024-02-18 NOTE — Telephone Encounter (Signed)
 Pt ready for scheduling for PROLIA on or after : 02/18/24  Option# 1: Buy/Bill (Office supplied medication)  Out-of-pocket cost due at time of clinic visit: $357  Number of injection/visits approved: 2  Primary: [employer] Prolia co-insurance: 20% Admin fee co-insurance: 20%  Secondary: --- Prolia co-insurance:  Admin fee co-insurance:   Medical Benefit Details: Date Benefits were checked: 02/15/24 Deductible: NO/ Coinsurance: 20%/ Admin Fee: 20%  Prior Auth: APPROVED PA# [id number] Expiration Date: 08/02/23-08/01/24  # of doses approved: 2 ----------------------------------------------------------------------- Option# 2- Med Obtained from pharmacy:  Pharmacy benefit: Copay $645.79 (Paid to pharmacy) Admin Fee: $25 (Pay at clinic)  Prior Auth: N/A PA# Expiration Date:   # of doses approved:   If patient wants fill through the pharmacy benefit please send prescription to: [employer], and include estimated need by date in rx notes. Pharmacy will ship medication directly to the office.  Patient NOT eligible for Prolia Copay Card. Copay Card can make patient's cost as little as $25. Link to apply: [email or web address]://www.amgensupportplus.com/copay  ** This summary of benefits is an estimation of the patient's out-of-pocket cost. Exact cost may very based on individual plan coverage.

## 2024-02-18 NOTE — Telephone Encounter (Signed)
 Shelley Snyder

## 2024-02-20 ENCOUNTER — Encounter: Payer: Self-pay | Admitting: Family Medicine

## 2024-02-20 DIAGNOSIS — M810 Age-related osteoporosis without current pathological fracture: Secondary | ICD-10-CM

## 2024-02-21 MED ORDER — DENOSUMAB 60 MG/ML ~~LOC~~ SOSY: 60.0000 mg | PREFILLED_SYRINGE | Freq: Once | SUBCUTANEOUS | 0 refills | Status: AC

## 2024-02-21 NOTE — Telephone Encounter (Signed)
 Prescription for Prolia 60mg  sent to  [id number] - [place], [place] - [address]

## 2024-02-23 MED ORDER — ALENDRONATE SODIUM 70 MG PO TABS: 70.0000 mg | ORAL_TABLET | ORAL | 11 refills | Status: DC

## 2024-02-23 NOTE — Progress Notes (Signed)
 See Specialty Med Biv (Prolia) encounter from 3/11.   It is cheaper for office to do a buy/bill.  Out of pocket would be $357 instead of $645 with pharmacy benefits.   Advised patient to call office to discuss this option further. Dis-enrolling.

## 2024-03-01 ENCOUNTER — Encounter: Payer: Self-pay | Admitting: Family Medicine

## 2024-03-01 DIAGNOSIS — M810 Age-related osteoporosis without current pathological fracture: Secondary | ICD-10-CM

## 2024-03-02 MED ORDER — RISEDRONATE SODIUM 35 MG PO TABS: 35.0000 mg | ORAL_TABLET | ORAL | 3 refills | Status: DC

## 2024-03-02 NOTE — Addendum Note (Signed)
 Addended by: Loyola Mast on: 03/02/2024 09:13 AM   Modules accepted: Orders

## 2024-03-21 ENCOUNTER — Encounter: Payer: Self-pay | Admitting: Family Medicine

## 2024-04-06 ENCOUNTER — Encounter: Payer: Self-pay | Admitting: Internal Medicine

## 2024-04-06 ENCOUNTER — Ambulatory Visit: Admitting: Internal Medicine

## 2024-04-06 VITALS — BP 170/100 | HR 75 | Temp 97.6°F | Resp 18 | Wt 132.8 lb

## 2024-04-06 DIAGNOSIS — I10 Essential (primary) hypertension: Secondary | ICD-10-CM

## 2024-04-06 DIAGNOSIS — M79604 Pain in right leg: Secondary | ICD-10-CM | POA: Diagnosis not present

## 2024-04-06 DIAGNOSIS — M810 Age-related osteoporosis without current pathological fracture: Secondary | ICD-10-CM | POA: Diagnosis not present

## 2024-04-06 DIAGNOSIS — T50905A Adverse effect of unspecified drugs, medicaments and biological substances, initial encounter: Secondary | ICD-10-CM

## 2024-04-06 MED ORDER — DICLOFENAC SODIUM 75 MG PO TBEC: 75.0000 mg | DELAYED_RELEASE_TABLET | Freq: Two times a day (BID) | ORAL | 0 refills | Status: DC | PRN

## 2024-04-06 NOTE — Progress Notes (Signed)
 [facility] [address] [place] [address] Dept: [phone] Dept Fax: [phone]  Acute Care Office Visit  Subjective:   Shelley Snyder May 11, 1941 04/06/2024  Chief Complaint  Patient presents with   Leg Swelling    Pt c/o of right and left leg swelling with pain and elevated HTN side effect after taking Actonel  35 mg; Pt is requesting RX alternative (prolia  injection)      HPI:  History of Present Illness   Shelley Snyder is an 83 year old female with osteoporosis who presents with leg swelling and pain after starting Actonel .  She began experiencing leg swelling and pain after starting Actonel  (risedronate ) in early March. She has taken four doses, with symptoms beginning after the first week. The swelling and aching are primarily in her right leg, described as a 'toothache' sensation, extending from the calf to the buttock.  She does report some chronic swelling in lower extremities.  She has noted an increase in blood pressure since starting Actonel , with readings rising from her usual 130s to 140s, and a reading of 170/100 today. She has been taking hydrochlorothiazide  for blood pressure management.  She did undergo a tooth extraction today with numbing, possible use of epinephrine during procedure.  For pain relief, she has been taking Tylenol  and Advil but prefers not to take pills. She tried a friend's diclofenac  75mg , which helped manage pain, but she only took one dose per day due to limited availability.  No gastrointestinal upset  related to Actonel .  Her PCP had initially advised her to start the Prolia , but due to cost, patient was placed on bisphosphonate therapy.  Patient had been recommended to do the Fosamax , but after patient talked with her OB/GYN, she preferred to try the Actonel .   The following portions of the patient's history were reviewed and updated as appropriate: past medical history, past  surgical history, family history, social history, allergies, medications, and problem list.   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 Up to 2 BID., Disp: , Rfl:    Ascorbic Acid (VITAMIN C) 1000 MG tablet, Take 1,000 mg by mouth daily., Disp: , Rfl:    Calcium Carb-Cholecalciferol 600-800 MG-UNIT TABS, Take 2 tablets by mouth daily., Disp: , Rfl:    Cholecalciferol (VITAMIN D -3 PO), Take by mouth., Disp: , Rfl:    cyanocobalamin (VITAMIN B12) 1000  MCG tablet, Take 1,000 mcg by mouth daily., Disp: , Rfl:    diclofenac  (VOLTAREN ) 75 MG EC tablet, Take 1 tablet (75 mg total) by mouth 2 (two) times daily as needed for moderate pain (pain score 4-6)., Disp: 30 tablet, Rfl: 0   hydrochlorothiazide  (HYDRODIURIL ) 25 MG tablet, Take 1 tablet (25 mg total) by mouth daily., Disp: 90 tablet, Rfl: 3   lactobacillus acidophilus (BACID) TABS tablet, Take 1 tablet by mouth daily., Disp: , Rfl:    Magnesium 500 MG CAPS, , Disp: , Rfl:    Melatonin 10 MG  TABS, Take by mouth., Disp: , Rfl:    Multiple Vitamins-Minerals (MULTIVITAMIN ADULTS 50+) TABS, Take by mouth., Disp: , Rfl:    fexofenadine (ALLEGRA) 180 MG tablet, Take 180 mg by mouth daily., Disp: , Rfl:   Current Facility-Administered Medications:    denosumab  (PROLIA ) injection 60 mg, 60 mg, Subcutaneous, Once, Rudd, Malcolm Scrivener, MD No Known Allergies   ROS: A complete ROS was performed with pertinent positives/negatives noted in the HPI. The remainder of the ROS are negative.    Objective:   Today's Vitals   04/06/24 1535 04/06/24 1612  BP: (!) 188/116 (!) 170/100  Pulse: 75   Resp: 18   Temp: 97.6 F (36.4 C)   TempSrc: Temporal   SpO2: 99%   Weight: 132 lb 12.8 oz (60.2 kg)   PainSc: 4    PainLoc: Leg     GENERAL: Well-appearing, in NAD. Well nourished.  SKIN: Pink, warm and dry. No rash, lesion, ulceration, or ecchymoses.  RESPIRATORY: Chest wall symmetrical. Respirations even and non-labored.  CARDIAC:  Peripheral pulses 2+ bilaterally.  MSK: Muscle tone and strength appropriate for age. Joints w/o tenderness, redness, or swelling. EXTREMITIES: Without clubbing, cyanosis, or edema.  1+ edema to ankles bilaterally NEUROLOGIC: No motor or sensory deficits. Steady, even gait.  PSYCH/MENTAL STATUS: Alert, oriented x 3. Cooperative, appropriate mood and affect.    No results found for any visits on 04/06/24.    Assessment & Plan:  Assessment and Plan    Hypertension Blood pressure elevated at 170/100 mmHg, likely due to epinephrine from dental procedure. Previously well-controlled. - Monitor blood pressure at home  - Continue HCTZ as prescribed - Return to clinic if blood pressure remains elevated.  Right Lower Extremity Pain   Pain likely due to Actonel , affecting daily activities. - Discontinue Actonel . - Prescribe diclofenac  twice daily as needed for pain.  Osteoporosis Osteoporosis management complicated by Actonel  side effects.  - Discontinue  Actonel  - Restart Prolia  injections. - Referral placed to Osteoporosis Clinic      Meds ordered this encounter  Medications   diclofenac  (VOLTAREN ) 75 MG EC tablet    Sig: Take 1 tablet (75 mg total) by mouth 2 (two) times daily as needed for moderate pain (pain score 4-6).    Dispense:  30 tablet    Refill:  0    Supervising Provider:   THOMPSON, AARON B [[id number]]   Orders Placed This Encounter  Procedures   Amb Referral to Osteoporosis Management     Referral Priority:   Routine    Referral Type:   Consultation    Number of Visits Requested:   1   Lab Orders  No laboratory test(s) ordered today   No images are attached to the encounter or orders placed in the encounter.  Return for Scheduled Routine Office Visits and as needed.   Gavin Kast, FNP

## 2024-05-04 ENCOUNTER — Other Ambulatory Visit: Payer: Self-pay

## 2024-05-04 ENCOUNTER — Emergency Department (HOSPITAL_BASED_OUTPATIENT_CLINIC_OR_DEPARTMENT_OTHER)
Admission: EM | Admit: 2024-05-04 | Discharge: 2024-05-04 | Disposition: A | Discharge status: Home / Self Care | Attending: Emergency Medicine | Admitting: Emergency Medicine

## 2024-05-04 ENCOUNTER — Encounter (HOSPITAL_BASED_OUTPATIENT_CLINIC_OR_DEPARTMENT_OTHER): Payer: Self-pay | Admitting: Emergency Medicine

## 2024-05-04 DIAGNOSIS — R197 Diarrhea, unspecified: Secondary | ICD-10-CM | POA: Insufficient documentation

## 2024-05-04 LAB — CBC WITH DIFFERENTIAL/PLATELET
Abs Immature Granulocytes: 0.01 10*3/uL (ref 0.00–0.07)
Basophils Absolute: 0 10*3/uL (ref 0.0–0.1)
Basophils Relative: 1 %
Eosinophils Absolute: 0.1 10*3/uL (ref 0.0–0.5)
Eosinophils Relative: 1 %
HCT: 39.7 % (ref 36.0–46.0)
Hemoglobin: 13.5 g/dL (ref 12.0–15.0)
Immature Granulocytes: 0 %
Lymphocytes Relative: 29 %
Lymphs Abs: 1.7 10*3/uL (ref 0.7–4.0)
MCH: 31.8 pg (ref 26.0–34.0)
MCHC: 34 g/dL (ref 30.0–36.0)
MCV: 93.4 fL (ref 80.0–100.0)
Monocytes Absolute: 0.4 10*3/uL (ref 0.1–1.0)
Monocytes Relative: 8 %
Neutro Abs: 3.4 10*3/uL (ref 1.7–7.7)
Neutrophils Relative %: 61 %
Platelets: 217 10*3/uL (ref 150–400)
RBC: 4.25 MIL/uL (ref 3.87–5.11)
RDW: 14.1 % (ref 11.5–15.5)
WBC: 5.6 10*3/uL (ref 4.0–10.5)
nRBC: 0 % (ref 0.0–0.2)

## 2024-05-04 LAB — COMPREHENSIVE METABOLIC PANEL WITH GFR
ALT: 16 U/L (ref 0–44)
AST: 25 U/L (ref 15–41)
Albumin: 4.3 g/dL (ref 3.5–5.0)
Alkaline Phosphatase: 78 U/L (ref 38–126)
Anion gap: 13 (ref 5–15)
BUN: 20 mg/dL (ref 8–23)
CO2: 26 mmol/L (ref 22–32)
Calcium: 9.5 mg/dL (ref 8.9–10.3)
Chloride: 102 mmol/L (ref 98–111)
Creatinine, Ser: 0.84 mg/dL (ref 0.44–1.00)
GFR, Estimated: >60 mL/min (ref >60)
Glucose, Bld: 97 mg/dL (ref 70–99)
Potassium: 3.9 mmol/L (ref 3.5–5.1)
Sodium: 141 mmol/L (ref 135–145)
Total Bilirubin: 0.5 mg/dL (ref 0.0–1.2)
Total Protein: 7.1 g/dL (ref 6.5–8.1)

## 2024-05-04 LAB — MAGNESIUM: Magnesium: 1.9 mg/dL (ref 1.7–2.4)

## 2024-05-04 MED ORDER — SODIUM CHLORIDE 0.9 % IV BOLUS
1000.0000 mL | Freq: Once | INTRAVENOUS | Status: AC
  Administered 2024-05-04: 1000 mL via INTRAVENOUS

## 2024-05-04 NOTE — ED Provider Notes (Signed)
 [place] [facility]MENT AT [facility] Provider Note   CSN: [id number] Arrival date & time: 05/04/24  0845     History  Chief Complaint  Patient presents with   Diarrhea    Shelley Snyder is a 83 y.o. female.  83 yo F with a chief complaint of diarrhea.  This been going on for about 3 to 4 days now.  She had some barbecue the night before and thinks maybe that was the cause of it.  No one else seem to have had a problem with the meal.  She otherwise denies suspicious food or water intake.  Denies recent travel.  Last was on antibiotics a couple months ago.  She denies any dark or bloody stool.  No fevers.  No abdominal pain.  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   Diarrhea      Home Medications Prior to Admission medications   Medication Sig Start Date End Date Taking? Authorizing Provider  acetaminophen  (TYLENOL ) 650 MG CR tablet Take 650 mg by mouth every 8 (eight) hours as needed for pain. Up to 2 BID.    [provider]  Ascorbic Acid (VITAMIN C) 1000 MG tablet Take 1,000 mg by mouth daily.    [provider]  Calcium Carb-Cholecalciferol 600-800 MG-UNIT TABS Take 2 tablets by mouth daily.    [provider]  Cholecalciferol (VITAMIN D -3 PO) Take by mouth.    [provider]  cyanocobalamin (VITAMIN B12) 1000 MCG tablet Take 1,000 mcg by mouth daily.    [provider]  diclofenac  (VOLTAREN ) 75 MG EC tablet Take 1 tablet (75 mg total) by mouth 2 (two) times daily as needed for moderate pain (pain score 4-6). 04/06/24   Gavin Kast, FNP  fexofenadine (ALLEGRA) 180 MG tablet Take 180 mg by mouth daily.    [provider]  hydrochlorothiazide  (HYDRODIURIL ) 25 MG tablet Take 1 tablet (25 mg total) by mouth daily. 11/18/23   Graig Lawyer, MD  lactobacillus acidophilus (BACID) TABS tablet Take 1 tablet by mouth daily.    [provider]   Magnesium 500 MG CAPS  04/07/23   [provider]  Melatonin 10 MG TABS Take by mouth.    [provider]  Multiple Vitamins-Minerals (MULTIVITAMIN ADULTS 50+) TABS Take by mouth.    [provider]      Allergies    Patient has no known allergies.    Review of Systems   Review of Systems  Gastrointestinal:  Positive for diarrhea.    Physical Exam Updated Vital Signs BP (!) 157/88   Pulse 75   Temp 97.6 F (36.4 C)   Resp 14   Wt 58.1 kg   SpO2 100%   BMI 21.97 kg/m  Physical Exam Vitals and nursing note reviewed.  Constitutional:      General: She is not in acute distress.    Appearance: She is well-developed. She is not diaphoretic.  HENT:     Head: Normocephalic and atraumatic.  Eyes:     Pupils: Pupils are equal, round, and reactive to light.  Cardiovascular:     Rate and Rhythm: Normal rate and regular rhythm.     Heart sounds: No murmur heard.    No friction rub. No gallop.  Pulmonary:     Effort: Pulmonary effort is normal.     Breath sounds: No wheezing or rales.  Abdominal:     General: There  is no distension.     Palpations: Abdomen is soft.     Tenderness: There is no abdominal tenderness.  Musculoskeletal:        General: No tenderness.     Cervical back: Normal range of motion and neck supple.  Skin:    General: Skin is warm and dry.  Neurological:     Mental Status: She is alert and oriented to person, place, and time.  Psychiatric:        Behavior: Behavior normal.     ED Results / Procedures / Treatments   Labs (all labs ordered are listed, but only abnormal results are displayed) Labs Reviewed  GASTROINTESTINAL PANEL BY PCR, STOOL (REPLACES STOOL CULTURE)  CBC WITH DIFFERENTIAL/PLATELET  COMPREHENSIVE METABOLIC PANEL WITH GFR  MAGNESIUM    EKG None  Radiology No results found.  Procedures Procedures    Medications Ordered in ED Medications  sodium chloride 0.9 % bolus 1,000 mL (0 mLs Intravenous  Stopped 05/04/24 1031)    ED Course/ Medical Decision Making/ A&P                                 Medical Decision Making Amount and/or Complexity of Data Reviewed Labs: ordered.   83 yo F with a chief complaints of diarrhea.  Going on for about 3 to 4 days now.  Well-appearing and nontoxic.  Has tried Imodium at home and feels like it is not working though does tell me that her frequency of stools has slowed down significantly.  Benign abdominal exam.  Will check electrolytes here.  Will send off stool studies if patient is able.  PCP follow-up.  No leukocytosis no anemia, no significant electrolyte abnormalities.  Will discharge home.  PCP follow-up.  11:19 AM:  I have discussed the diagnosis/risks/treatment options with the patient.  Evaluation and diagnostic testing in the emergency department does not suggest an emergent condition requiring admission or immediate intervention beyond what has been performed at this time.  They will follow up with PCP. We also discussed returning to the ED immediately if new or worsening sx occur. We discussed the sx which are most concerning (e.g., sudden worsening pain, fever, inability to tolerate by mouth) that necessitate immediate return. Medications administered to the patient during their visit and any new prescriptions provided to the patient are listed below.  Medications given during this visit Medications  sodium chloride 0.9 % bolus 1,000 mL (0 mLs Intravenous Stopped 05/04/24 1031)     The patient appears reasonably screen and/or stabilized for discharge and I doubt any other medical condition or other [facility] requiring further screening, evaluation, or treatment in the ED at this time prior to discharge.          Final Clinical Impression(s) / ED Diagnoses Final diagnoses:  Diarrhea, unspecified type    Rx / DC Orders ED Discharge Orders     None         Albertus Hughs, DO 05/04/24 1119

## 2024-05-04 NOTE — Discharge Instructions (Signed)
 Follow-up with your family doctor in the office.  Please return for dark stool or blood in your stool or abdominal pain or fever.  You can continue take Imodium as needed for diarrhea.

## 2024-05-04 NOTE — ED Triage Notes (Signed)
 Diarrhea x 4 days , denies abd pain nor NV . No obvious distress .

## 2024-05-05 ENCOUNTER — Ambulatory Visit: Payer: Self-pay

## 2024-05-05 NOTE — Telephone Encounter (Signed)
 Summary: Diarrhea   Copied From CRM [id number]. Reason for Triage: Patient was seen in the Er yesterday for Diarrhea and was given Imodium. She states that it isn't working. She has used the bathroom 5 times today.         Was seen in ER yesterday; very frustrated that she has to wait until Monday to be seen for her hospital f/u.  No triage necessary.

## 2024-05-08 ENCOUNTER — Ambulatory Visit: Admitting: Nurse Practitioner

## 2024-05-08 ENCOUNTER — Ambulatory Visit: Admitting: Physician Assistant

## 2024-05-09 ENCOUNTER — Ambulatory Visit
Admission: RE | Admit: 2024-05-09 | Discharge: 2024-05-09 | Disposition: A | Discharge status: Home / Self Care | Source: Ambulatory Visit

## 2024-05-09 VITALS — BP 155/80 | HR 100 | Temp 98.0°F | Resp 17

## 2024-05-09 DIAGNOSIS — K580 Irritable bowel syndrome with diarrhea: Secondary | ICD-10-CM | POA: Diagnosis not present

## 2024-05-09 MED ORDER — LOPERAMIDE HCL 2 MG PO CAPS: 4.0000 mg | ORAL_CAPSULE | Freq: Four times a day (QID) | ORAL | 0 refills | Status: AC | PRN

## 2024-05-09 MED ORDER — DIPHENOXYLATE-ATROPINE 2.5-0.025 MG PO TABS: 1.0000 | ORAL_TABLET | Freq: Four times a day (QID) | ORAL | 0 refills | Status: DC | PRN

## 2024-05-09 NOTE — Discharge Instructions (Addendum)
 1. Irritable bowel syndrome with diarrhea (Primary) - loperamide (IMODIUM) 2 MG capsule; Take 2 capsules (4 mg total) by mouth 4 (four) times daily as needed for diarrhea or loose stools. Take 2 capsules prior to each meal and 2 capsules before bed to prevent diarrhea secondary to irritable bowel syndrome.  Dispense: 20 capsule; Refill: 0 -For today only take 1 capsule before dinner because you are using Lomotil which you have not taken before. - diphenoxylate-atropine (LOMOTIL) 2.5-0.025 MG tablet; Take 1 tablet by mouth 4 (four) times daily as needed for diarrhea or loose stools.  Dispense: 30 tablet; Refill: 0 -Only use Lomotil if you are having breakthrough diarrhea with taking the Imodium as directed before meals. - Ambulatory referral to Gastroenterology for follow-up evaluation and management of irritable bowel syndrome with diarrhea ongoing. -Continue to monitor symptoms for any change in severity if there is any escalation of current symptoms or development of new symptoms follow-up in ER for further evaluation and management.  Dosing - We start with loperamide 2 mg given 45 minutes before every meal. Dosing may be titrated up or down based on response, with a maximum dose of 16 mg daily. Loperamide may also be given prophylactically if specific situations are identified as symptom triggers (eg, exercise, stressful situations).  Treatment is continued for the duration of symptoms and may be titrated down or discontinued if symptoms abate. In patients with alternating diarrhea and constipation, loperamide should be used in limited doses due to the risk of exacerbating constipation.                                                                       2025 [employer]

## 2024-05-09 NOTE — ED Triage Notes (Addendum)
 She went to ER on 05/04/24 and they drew lab work which was normal. Told her to keep taking imodium. Pt presents today due to continued symptoms of diarrhea.   States this morning she could not get diarrhea to stop and has already taken 4 imodium

## 2024-05-09 NOTE — ED Provider Notes (Signed)
 [facility]  Note:  This document was prepared using Dragon voice recognition software and may include unintentional dictation errors.  MRN: [id number] DOB: [birth year]  Subjective:   Shelley Snyder is a 83 y.o. female presenting for uncontrolled diarrhea x 7 days.  Patient reports that she diarrhea all day and all night for the last 7 days.  Patient has been taking Imodium 4 tablets/day without any relief from her diarrhea symptoms.  Patient has past history of IBS-D but has not had any consistent problems for a while until now.  Patient has a follow-up appointment with her gastroenterologist but has not for 4 more weeks and she is concerned that she cannot continue monitoring this for 4 more weeks until she can see GI.  Patient also reached out to her primary care provider who was unable to see her until June 12.  Patient went to the emergency room on 529 lab work was completed and all normal they advised her to continue taking Imodium.  Patient reports that she has no nausea or vomiting and has been able to drink plenty of fluids and eat but every time she eats or drinks she has diarrhea.  Patient denies abdominal pain or cramping, fever, shortness of breath, chest pain, weakness, dizziness.   Current Facility-Administered Medications:    denosumab  (PROLIA ) injection 60 mg, 60 mg, Subcutaneous, Once, Rudd, Malcolm Scrivener, MD  Current Outpatient Medications:    diphenoxylate-atropine (LOMOTIL) 2.5-0.025 MG tablet, Take 1 tablet by mouth 4 (four) times daily as needed for diarrhea or loose stools., Disp: 30 tablet, Rfl: 0   loperamide (IMODIUM) 2 MG capsule, Take 2 capsules (4 mg total) by mouth 4 (four) times daily as needed for diarrhea or loose stools. Take 2 capsules prior to each meal and 2 capsules before bed to prevent diarrhea secondary to irritable bowel syndrome., Disp: 20 capsule, Rfl: 0   acetaminophen  (TYLENOL ) 650 MG CR tablet, Take 650 mg by mouth every 8 (eight)  hours as needed for pain. Up to 2 BID., Disp: , Rfl:    Ascorbic Acid (VITAMIN C) 1000 MG tablet, Take 1,000 mg by mouth daily., Disp: , Rfl:    Calcium Carb-Cholecalciferol 600-800 MG-UNIT TABS, Take 2 tablets by mouth daily., Disp: , Rfl:    Cholecalciferol (VITAMIN D -3 PO), Take by mouth., Disp: , Rfl:    cyanocobalamin (VITAMIN B12) 1000 MCG tablet, Take 1,000 mcg by mouth daily., Disp: , Rfl:    diclofenac  (VOLTAREN ) 75 MG EC tablet, Take 1 tablet (75 mg total) by mouth 2 (two) times daily as needed for moderate pain (pain score 4-6)., Disp: 30 tablet, Rfl: 0   fexofenadine (ALLEGRA) 180 MG tablet, Take 180 mg by mouth daily., Disp: , Rfl:    hydrochlorothiazide  (HYDRODIURIL ) 25 MG tablet, Take 1 tablet (25 mg total) by mouth daily., Disp: 90 tablet, Rfl: 3   lactobacillus acidophilus (BACID) TABS tablet, Take 1 tablet by mouth daily., Disp: , Rfl:    Magnesium 500 MG CAPS, , Disp: , Rfl:    Melatonin 10 MG TABS, Take by mouth., Disp: , Rfl:    Multiple Vitamins-Minerals (MULTIVITAMIN ADULTS 50+) TABS, Take by mouth., Disp: , Rfl:    No Known Allergies  Past Medical History:  Diagnosis Date   Allergy      Past Surgical History:  Procedure Laterality Date   ABDOMINAL HYSTERECTOMY     aprtial   BLADDER REPAIR     bladder tact   BREAST SURGERY  PERINEAL PROCTECTOMY     RECTAL PROLAPSE REPAIR     TONSILLECTOMY      Family History  Problem Relation Age of Onset   Cancer Mother        Lung   Hypertension Mother    Cancer Father        Mesothelioma    Social History   Tobacco Use   Smoking status: Former    Current packs/day: 0.00    Average packs/day: 0.3 packs/day for 1 year (0.3 ttl pk-yrs)    Types: Cigarettes    Quit date: 12/07/1984    Years since quitting: 39.4   Smokeless tobacco: Never  Vaping Use   Vaping status: Never Used  Substance Use Topics   Alcohol use: Yes    Alcohol/week: 7.0 standard drinks of alcohol    Types: 7 Glasses of wine per week     Comment: socially   Drug use: No    ROS Refer to HPI for ROS details.  Objective:    Vitals: BP (!) 155/80 (BP Location: Left Arm)   Pulse 100   Temp 98 F (36.7 C) (Temporal)   Resp 17   SpO2 96%   Physical Exam Vitals and nursing note reviewed.  Constitutional:      General: She is not in acute distress.    Appearance: Normal appearance. She is well-developed. She is not ill-appearing or toxic-appearing.  HENT:     Head: Normocephalic and atraumatic.     Mouth/Throat:     Mouth: Mucous membranes are moist.     Pharynx: Oropharynx is clear.  Cardiovascular:     Rate and Rhythm: Normal rate.  Pulmonary:     Effort: Pulmonary effort is normal. No respiratory distress.  Abdominal:     General: There is no distension.     Palpations: Abdomen is soft.     Tenderness: There is no abdominal tenderness. There is no right CVA tenderness, left CVA tenderness, guarding or rebound.  Skin:    General: Skin is warm and dry.  Neurological:     General: No focal deficit present.     Mental Status: She is alert and oriented to person, place, and time.  Psychiatric:        Mood and Affect: Mood normal.        Behavior: Behavior normal.     Procedures  No results found for this or any previous visit (from the past 24 hours).  Assessment and Plan :     Discharge Instructions      1. Irritable bowel syndrome with diarrhea (Primary) - loperamide (IMODIUM) 2 MG capsule; Take 2 capsules (4 mg total) by mouth 4 (four) times daily as needed for diarrhea or loose stools. Take 2 capsules prior to each meal and 2 capsules before bed to prevent diarrhea secondary to irritable bowel syndrome.  Dispense: 20 capsule; Refill: 0 -For today only take 1 capsule before dinner because you are using Lomotil which you have not taken before. - diphenoxylate-atropine (LOMOTIL) 2.5-0.025 MG tablet; Take 1 tablet by mouth 4 (four) times daily as needed for diarrhea or loose stools.  Dispense: 30  tablet; Refill: 0 -Only use Lomotil if you are having breakthrough diarrhea with taking the Imodium as directed before meals. - Ambulatory referral to Gastroenterology for follow-up evaluation and management of irritable bowel syndrome with diarrhea ongoing. -Continue to monitor symptoms for any change in severity if there is any escalation of current symptoms or development of new symptoms follow-up  in ER for further evaluation and management.  Dosing - We start with loperamide 2 mg given 45 minutes before every meal. Dosing may be titrated up or down based on response, with a maximum dose of 16 mg daily. Loperamide may also be given prophylactically if specific situations are identified as symptom triggers (eg, exercise, stressful situations).  Treatment is continued for the duration of symptoms and may be titrated down or discontinued if symptoms abate. In patients with alternating diarrhea and constipation, loperamide should be used in limited doses due to the risk of exacerbating constipation.                                                                       [address] [employer]     Alease Hunter   [place], [place], [place] 05/09/24 1546

## 2024-05-11 ENCOUNTER — Emergency Department (HOSPITAL_BASED_OUTPATIENT_CLINIC_OR_DEPARTMENT_OTHER)
Admission: EM | Admit: 2024-05-11 | Discharge: 2024-05-11 | Disposition: A | Discharge status: Home / Self Care | Attending: Emergency Medicine | Admitting: Emergency Medicine

## 2024-05-11 ENCOUNTER — Other Ambulatory Visit: Payer: Self-pay

## 2024-05-11 ENCOUNTER — Encounter (HOSPITAL_BASED_OUTPATIENT_CLINIC_OR_DEPARTMENT_OTHER): Payer: Self-pay | Admitting: Emergency Medicine

## 2024-05-11 ENCOUNTER — Emergency Department (HOSPITAL_BASED_OUTPATIENT_CLINIC_OR_DEPARTMENT_OTHER)

## 2024-05-11 ENCOUNTER — Other Ambulatory Visit (HOSPITAL_BASED_OUTPATIENT_CLINIC_OR_DEPARTMENT_OTHER): Payer: Self-pay | Admitting: Family Medicine

## 2024-05-11 DIAGNOSIS — H3530 Unspecified macular degeneration: Secondary | ICD-10-CM | POA: Diagnosis not present

## 2024-05-11 DIAGNOSIS — Z8249 Family history of ischemic heart disease and other diseases of the circulatory system: Secondary | ICD-10-CM | POA: Diagnosis not present

## 2024-05-11 DIAGNOSIS — R109 Unspecified abdominal pain: Secondary | ICD-10-CM | POA: Diagnosis not present

## 2024-05-11 DIAGNOSIS — M810 Age-related osteoporosis without current pathological fracture: Secondary | ICD-10-CM | POA: Diagnosis not present

## 2024-05-11 DIAGNOSIS — R197 Diarrhea, unspecified: Secondary | ICD-10-CM | POA: Insufficient documentation

## 2024-05-11 DIAGNOSIS — N132 Hydronephrosis with renal and ureteral calculous obstruction: Secondary | ICD-10-CM | POA: Diagnosis not present

## 2024-05-11 DIAGNOSIS — K5730 Diverticulosis of large intestine without perforation or abscess without bleeding: Secondary | ICD-10-CM | POA: Diagnosis not present

## 2024-05-11 DIAGNOSIS — Z79899 Other long term (current) drug therapy: Secondary | ICD-10-CM | POA: Insufficient documentation

## 2024-05-11 DIAGNOSIS — Z809 Family history of malignant neoplasm, unspecified: Secondary | ICD-10-CM | POA: Diagnosis not present

## 2024-05-11 DIAGNOSIS — K7689 Other specified diseases of liver: Secondary | ICD-10-CM | POA: Diagnosis not present

## 2024-05-11 DIAGNOSIS — M1990 Unspecified osteoarthritis, unspecified site: Secondary | ICD-10-CM | POA: Diagnosis not present

## 2024-05-11 DIAGNOSIS — J301 Allergic rhinitis due to pollen: Secondary | ICD-10-CM | POA: Diagnosis not present

## 2024-05-11 DIAGNOSIS — R9431 Abnormal electrocardiogram [ECG] [EKG]: Secondary | ICD-10-CM | POA: Diagnosis not present

## 2024-05-11 DIAGNOSIS — K449 Diaphragmatic hernia without obstruction or gangrene: Secondary | ICD-10-CM | POA: Diagnosis not present

## 2024-05-11 DIAGNOSIS — I10 Essential (primary) hypertension: Secondary | ICD-10-CM | POA: Diagnosis not present

## 2024-05-11 DIAGNOSIS — Z1231 Encounter for screening mammogram for malignant neoplasm of breast: Secondary | ICD-10-CM

## 2024-05-11 LAB — CBC
HCT: 38.8 % (ref 36.0–46.0)
Hemoglobin: 13.3 g/dL (ref 12.0–15.0)
MCH: 31.4 pg (ref 26.0–34.0)
MCHC: 34.3 g/dL (ref 30.0–36.0)
MCV: 91.7 fL (ref 80.0–100.0)
Platelets: 193 10*3/uL (ref 150–400)
RBC: 4.23 MIL/uL (ref 3.87–5.11)
RDW: 13.6 % (ref 11.5–15.5)
WBC: 6.2 10*3/uL (ref 4.0–10.5)
nRBC: 0 % (ref 0.0–0.2)

## 2024-05-11 LAB — COMPREHENSIVE METABOLIC PANEL WITH GFR
ALT: 14 U/L (ref 0–44)
AST: 28 U/L (ref 15–41)
Albumin: 4.4 g/dL (ref 3.5–5.0)
Alkaline Phosphatase: 79 U/L (ref 38–126)
Anion gap: 13 (ref 5–15)
BUN: 21 mg/dL (ref 8–23)
CO2: 25 mmol/L (ref 22–32)
Calcium: 9.6 mg/dL (ref 8.9–10.3)
Chloride: 100 mmol/L (ref 98–111)
Creatinine, Ser: 0.8 mg/dL (ref 0.44–1.00)
GFR, Estimated: >60 mL/min (ref >60)
Glucose, Bld: 109 mg/dL — ABNORMAL HIGH (ref 70–99)
Potassium: 3.9 mmol/L (ref 3.5–5.1)
Sodium: 138 mmol/L (ref 135–145)
Total Bilirubin: 0.5 mg/dL (ref 0.0–1.2)
Total Protein: 7.3 g/dL (ref 6.5–8.1)

## 2024-05-11 LAB — URINALYSIS, ROUTINE W REFLEX MICROSCOPIC
Bilirubin Urine: NEGATIVE
Glucose, UA: NEGATIVE mg/dL
Hgb urine dipstick: NEGATIVE
Ketones, ur: NEGATIVE mg/dL
Leukocytes,Ua: NEGATIVE
Nitrite: NEGATIVE
Protein, ur: NEGATIVE mg/dL
Specific Gravity, Urine: 1.01 (ref 1.005–1.030)
pH: 5.5 (ref 5.0–8.0)

## 2024-05-11 LAB — LIPASE, BLOOD: Lipase: 13 U/L (ref 11–51)

## 2024-05-11 MED ORDER — DEXAMETHASONE SODIUM PHOSPHATE 10 MG/ML IJ SOLN
10.0000 mg | Freq: Once | INTRAMUSCULAR | Status: AC
  Administered 2024-05-11: 10 mg via INTRAVENOUS
  Filled 2024-05-11: qty 1

## 2024-05-11 MED ORDER — SODIUM CHLORIDE 0.9 % IV BOLUS
1000.0000 mL | Freq: Once | INTRAVENOUS | Status: AC
  Administered 2024-05-11: 1000 mL via INTRAVENOUS

## 2024-05-11 MED ORDER — IOHEXOL 300 MG/ML  SOLN
100.0000 mL | Freq: Once | INTRAMUSCULAR | Status: AC | PRN
  Administered 2024-05-11: 100 mL via INTRAVENOUS

## 2024-05-11 MED ORDER — BUDESONIDE ER 9 MG PO TB24: 9.0000 mg | ORAL_TABLET | Freq: Every day | ORAL | 0 refills | Status: AC

## 2024-05-11 NOTE — ED Triage Notes (Signed)
 Pt via POV c/o continued diarrhea, seen last week for same, currently on day 12. Seen at [facility] on Tuesday with no improvement; pt notes hx microcolitis. She feels dehydrated. Estimates 12 episodes of diarrhea today so far.

## 2024-05-11 NOTE — ED Notes (Signed)
 Pt. Reports being seen here for same complaints earlier in week and still having same issues.  Pt. Reports she is taking meds with no results.,

## 2024-05-11 NOTE — ED Provider Notes (Addendum)
 [place] [facility] Provider Note   CSN: [id number] Arrival date & time: 05/11/24  1743     History  Chief Complaint  Patient presents with   Diarrhea    Shelley Snyder is a 83 y.o. female.  Pt is a 83 yo female with pmhx significant for microcolitis and arthritis.  Pt said she's had diarrhea for 12 days.  She was seen in the ED on 5/29 and was told to f/u with pcp.  She went to UC on 6/3 and was told to take lomotil and imodium.  She said she is still not better and continues to have diarrhea.  She said she has no control and when it wants to come, it comes.  This feels similar to her microcolitis.       Home Medications Prior to Admission medications   Medication Sig Start Date End Date Taking? Authorizing Provider  Budesonide ER 9 MG TB24 Take 1 tablet (9 mg total) by mouth daily. 05/11/24  Yes Sueellen Emery, MD  acetaminophen  (TYLENOL ) 650 MG CR tablet Take 650 mg by mouth every 8 (eight) hours as needed for pain. Up to 2 BID.    [provider]  Ascorbic Acid (VITAMIN C) 1000 MG tablet Take 1,000 mg by mouth daily.    [provider]  Calcium Carb-Cholecalciferol 600-800 MG-UNIT TABS Take 2 tablets by mouth daily.    [provider]  Cholecalciferol (VITAMIN D -3 PO) Take by mouth.    [provider]  cyanocobalamin (VITAMIN B12) 1000 MCG tablet Take 1,000 mcg by mouth daily.    [provider]  diclofenac  (VOLTAREN ) 75 MG EC tablet Take 1 tablet (75 mg total) by mouth 2 (two) times daily as needed for moderate pain (pain score 4-6). 04/06/24   Gavin Kast, FNP  diphenoxylate-atropine (LOMOTIL) 2.5-0.025 MG tablet Take 1 tablet by mouth 4 (four) times daily as needed for diarrhea or loose stools. 05/09/24   Reddick, Johnathan B, NP  fexofenadine (ALLEGRA) 180 MG tablet Take 180 mg by mouth daily.    [provider]  hydrochlorothiazide  (HYDRODIURIL ) 25 MG tablet Take 1 tablet (25 mg total)  by mouth daily. 11/18/23   Graig Lawyer, MD  lactobacillus acidophilus (BACID) TABS tablet Take 1 tablet by mouth daily.    [provider]  loperamide (IMODIUM) 2 MG capsule Take 2 capsules (4 mg total) by mouth 4 (four) times daily as needed for diarrhea or loose stools. Take 2 capsules prior to each meal and 2 capsules before bed to prevent diarrhea secondary to irritable bowel syndrome. 05/09/24   Reddick, Johnathan B, NP  Magnesium 500 MG CAPS  04/07/23   [provider]  Melatonin 10 MG TABS Take by mouth.    [provider]  Multiple Vitamins-Minerals (MULTIVITAMIN ADULTS 50+) TABS Take by mouth.    [provider]      Allergies    Patient has no known allergies.    Review of Systems   Review of Systems  Gastrointestinal:  Positive for diarrhea.  All other systems reviewed and are negative.   Physical Exam Updated Vital Signs BP 139/89 (BP Location: Left Arm)   Pulse 86   Temp 98 F (36.7 C) (Oral)   Resp 16   Ht 5\' 4"  (1.626 m)   Wt 58.1 kg   SpO2 98%   BMI 21.97 kg/m  Physical Exam Vitals and nursing note reviewed.  Constitutional:      Appearance:  Normal appearance.  HENT:     Head: Normocephalic and atraumatic.     Right Ear: External ear normal.     Left Ear: External ear normal.     Nose: Nose normal.     Mouth/Throat:     Mouth: Mucous membranes are dry.  Eyes:     Extraocular Movements: Extraocular movements intact.     Conjunctiva/sclera: Conjunctivae normal.     Pupils: Pupils are equal, round, and reactive to light.  Cardiovascular:     Rate and Rhythm: Normal rate and regular rhythm.     Pulses: Normal pulses.     Heart sounds: Normal heart sounds.  Pulmonary:     Effort: Pulmonary effort is normal.     Breath sounds: Normal breath sounds.  Abdominal:     General: Abdomen is flat. Bowel sounds are normal.     Palpations: Abdomen is soft.  Musculoskeletal:        General: Normal range of motion.     Cervical  back: Normal range of motion and neck supple.  Skin:    General: Skin is warm.     Capillary Refill: Capillary refill takes less than 2 seconds.  Neurological:     General: No focal deficit present.     Mental Status: She is alert and oriented to person, place, and time.  Psychiatric:        Mood and Affect: Mood normal.        Behavior: Behavior normal.     ED Results / Procedures / Treatments   Labs (all labs ordered are listed, but only abnormal results are displayed) Labs Reviewed  COMPREHENSIVE METABOLIC PANEL WITH GFR - Abnormal; Notable for the following components:      Result Value   Glucose, Bld 109 (*)    All other components within normal limits  GASTROINTESTINAL PANEL BY PCR, STOOL (REPLACES STOOL CULTURE)  C DIFFICILE QUICK SCREEN W PCR REFLEX    LIPASE, BLOOD  CBC  URINALYSIS, ROUTINE W REFLEX MICROSCOPIC    EKG EKG Interpretation Date/Time:  Thursday May 11 2024 19:03:56 EDT Ventricular Rate:  70 PR Interval:  183 QRS Duration:  102 QT Interval:  434 QTC Calculation: 469 R Axis:   32  Text Interpretation: Sinus rhythm RSR' in V1 or V2, right VCD or RVH No significant change since last tracing Confirmed by Sueellen Emery [id number]) on 05/11/2024 7:47:59 PM  Radiology CT ABDOMEN PELVIS W CONTRAST Result Date: 05/11/2024 EXAM: CT ABDOMEN AND PELVIS WITH CONTRAST 05/11/2024 07:56:52 PM TECHNIQUE: CT of the abdomen and pelvis was performed with the administration of of intravenous iohexol (OMNIPAQUE) 300 MG/ML solution. Multiplanar reformatted images are provided for review. Automated exposure control, iterative reconstruction, and/or weight based adjustment of the mA/kV was utilized to reduce the radiation dose to as low as reasonably achievable. COMPARISON: None available. CLINICAL HISTORY: Abdominal pain, acute, nonlocalized; diarrhea. 83 y/o female c/o continued diarrhea, seen last week for same, currently on day 12. Seen at [facility] on Tuesday with no  improvement; pt notes hx microcolitis. Surgeries: Hysterectomy; bladder tac. 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 LIVER: Large simple right hepatic cyst measuring 8.0 cm (image 17), benign. GALLBLADDER AND BILE DUCTS: Gallbladder is unremarkable. No biliary ductal dilatation. SPLEEN: No acute abnormality. PANCREAS: No acute abnormality. ADRENAL GLANDS: No acute abnormality. KIDNEYS, URETERS AND BLADDER: 3 mm nonobstructing left upper pole renal calculus (image 22). Subcentimeter left lower pole renal cyst (image 33), benign (Bosniak 1). Left  renal sinus cysts, benign. Mild bilateral hydronephrosis, possibly secondary to bladder distention / chronic bladder outlet obstruction. No perinephric or periureteral stranding. Urinary bladder is unremarkable. GI AND BOWEL: Mild left colonic diverticulosis, without evidence of diverticulitis. Normal appendix (image 48). Stomach demonstrates no acute abnormality. There is no bowel obstruction. No appendicitis. PERITONEUM AND RETROPERITONEUM: No ascites. No free air. VASCULATURE: Aorta is normal in caliber. LYMPH NODES: No lymphadenopathy. REPRODUCTIVE ORGANS: No acute abnormality. BONES AND SOFT TISSUES: Severe compression fracture deformity at L5 (sagittal image 79) chronic, without retroflexion. Mild to moderate compression fracture deformities at T12, L2, L3, and L4 chronic. No focal soft tissue abnormality. IMPRESSION: 1. Mild left colonic diverticulosis, without evidence of diverticulitis. 2. Mild bilateral hydronephrosis, possibly secondary to bladder distention/chronic bladder outlet obstruction. 3. Large hiatal hernia/inverted intrathoracic stomach. Electronically signed by: Zadie Herter MD 05/11/2024 08:03 PM EDT RP Workstation: [id number]    Procedures Procedures    Medications Ordered in ED Medications  dexamethasone (DECADRON) injection 10 mg (has no administration in time range)  sodium chloride  0.9 % bolus  1,000 mL (1,000 mLs Intravenous New Bag/Given 05/11/24 1902)  iohexol (OMNIPAQUE) 300 MG/ML solution 100 mL (100 mLs Intravenous Contrast Given 05/11/24 1943)    ED Course/ Medical Decision Making/ A&P                                 Medical Decision Making Amount and/or Complexity of Data Reviewed Labs: ordered. Radiology: ordered.  Risk Prescription drug management.   This patient presents to the ED for concern of diarrhea, this involves an extensive number of treatment options, and is a complaint that carries with it a high risk of complications and morbidity.  The differential diagnosis includes colitis, diverticulitis, electrolyte abn   Co morbidities that complicate the patient evaluation  microcolitis and arthritis   Additional history obtained:  Additional history obtained from epic chart review  Lab Tests:  I Ordered, and personally interpreted labs.  The pertinent results include:  cbc nl, cmp nl, lip nl, ua nl   Imaging Studies ordered:  I ordered imaging studies including ct abd/pelvis  I independently visualized and interpreted imaging which showed  IMPRESSION:  1. Mild left colonic diverticulosis, without evidence of diverticulitis.  2. Mild bilateral hydronephrosis, possibly secondary to bladder  distention/chronic bladder outlet obstruction.  3. Large hiatal hernia/inverted intrathoracic stomach.   I agree with the radiologist interpretation   Cardiac Monitoring:  The patient was maintained on a cardiac monitor.  I personally viewed and interpreted the cardiac monitored which showed an underlying rhythm of: nsr   Medicines ordered and prescription drug management:  I ordered medication including ivfs/decadron  for sx  Reevaluation of the patient after these medicines showed that the patient improved I have reviewed the patients home medicines and have made adjustments as needed   Test Considered:  ct   Critical  Interventions:  ivfs   Problem List / ED Course:  Diarrhea with hx microcolitis and these sx feel similar.  She has not had any diarrhea here.  Pt was on budesonide in the past, so I am going to put her back on that.  I will give her her first month's supply (9 mg per day).  She knows she needs to get the rest from pcp/gi.  She does not have a current GI doc as her old GI doctors have retired or merged.  She is given a referral to  [facility].  Return if worse.    Reevaluation:  After the interventions noted above, I reevaluated the patient and found that they have :improved   Social Determinants of Health:  Lives at home   Dispostion:  After consideration of the diagnostic results and the patients response to treatment, I feel that the patent would benefit from discharge with outpatient f/u.          Final Clinical Impression(s) / ED Diagnoses Final diagnoses:  Diarrhea, unspecified type    Rx / DC Orders ED Discharge Orders          Ordered    Budesonide ER 9 MG TB24  Daily        05/11/24 2040              Sueellen Emery, MD 05/11/24 2040    Sueellen Emery, MD 05/11/24 2041

## 2024-05-18 ENCOUNTER — Ambulatory Visit: Payer: Medicare HMO | Admitting: Family Medicine

## 2024-06-08 DIAGNOSIS — K52832 Lymphocytic colitis: Secondary | ICD-10-CM | POA: Diagnosis not present

## 2024-06-21 ENCOUNTER — Encounter: Payer: Self-pay | Admitting: Family Medicine

## 2024-06-21 ENCOUNTER — Ambulatory Visit (INDEPENDENT_AMBULATORY_CARE_PROVIDER_SITE_OTHER): Admitting: Family Medicine

## 2024-06-21 VITALS — BP 130/72 | HR 76 | Temp 97.8°F | Ht 64.0 in | Wt 129.8 lb

## 2024-06-21 DIAGNOSIS — I10 Essential (primary) hypertension: Secondary | ICD-10-CM | POA: Diagnosis not present

## 2024-06-21 DIAGNOSIS — K52832 Lymphocytic colitis: Secondary | ICD-10-CM | POA: Diagnosis not present

## 2024-06-21 DIAGNOSIS — M810 Age-related osteoporosis without current pathological fracture: Secondary | ICD-10-CM

## 2024-06-21 NOTE — Assessment & Plan Note (Signed)
 Although her blood pressure was running high 2 months ago, reviewing her home BP log since then shows normal blood pressures. I will continue her on HCTZ 25 mg daily.

## 2024-06-21 NOTE — Progress Notes (Signed)
 [facility] [address] [place] [address] Dept: [phone] Dept Fax: [phone]  Chronic Care Office Visit  Subjective:    Patient ID: Shelley Snyder, female    DOB: [birth year], 83 y.o..   MRN: [id number]  Chief Complaint  Patient presents with   Follow-up    6 month f/u.     History of Present Illness:  Patient is in today for reassessment of chronic medical issues.  [name] has a history of osteoporosis with a prior compression fracture of the spine. She has been managed on denosumab  (Prolia ) injections every 6 months. She also takes a calcium with Vit. D supplement. The cost of the Prolia  went up this year for her. [name] decided not to take her injection in March. I recommended she consider going back on alendronate  (Fosamax ). She spoke with a friend who was a gynecologist about this change. The GYN recommended she take risedronate  (Actonel ) instead of alendronate . Within a few weeks of starting the Actonel , [name] developed right hip pain. She started taking Tylenol  and ibuprofen and alter diclofenac  to manage this. However, she also started to have elevated blood pressures, which she attributed to her pain. Later in May, [name] developed an issue with diarrhea. She was seen on 5/29 (ED), 6/3 (UC) and 6/5 (ED) for this issue. 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 At the 2nd ED visit, she was started on budesonide  9 mg daily and referred to GI, As she could not get in to see a [facility] doctor in a reasonable timeframe, shew as able to return to Dr. Tanya with [facility]. As the 9 mg budesonide  was quite expensive, he recommended she take budesonide  3 mg, three tablets daily, which is less expensive. She has noted improvement in her symptoms. Dr. Tanya felt the NSAID had likely triggered her colitis.  [name] is off of other treatments for her osteoporosis, but has double up on her calcium  and Vitamin D . She would like to wait until the end of the year, when she can change her Medicare coverage and then make a decision about resuming Prolia  or not.  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 Up to 2 BID.     Ascorbic Acid (VITAMIN C) 1000 MG tablet Take 1,000 mg by mouth daily.  Budesonide  ER 9 MG TB24 Take 1 tablet (9 mg total) by mouth daily. 30 tablet 0   Calcium Carb-Cholecalciferol 600-800 MG-UNIT TABS Take 2 tablets by mouth daily.     Cholecalciferol (VITAMIN D -3 PO) Take by mouth.     cyanocobalamin (VITAMIN B12) 1000 MCG tablet Take 1,000 mcg by mouth daily.     fexofenadine (ALLEGRA) 180 MG tablet Take 180 mg by mouth daily. (Patient taking differently: Take 180 mg by mouth as needed.)     hydrochlorothiazide  (HYDRODIURIL ) 25 MG tablet Take 1 tablet (25 mg total) by mouth daily. 90 tablet 3   lactobacillus acidophilus (BACID) TABS tablet Take 1 tablet by mouth daily.      loperamide  (IMODIUM ) 2 MG capsule Take 2 capsules (4 mg total) by mouth 4 (four) times daily as needed for diarrhea or loose stools. Take 2 capsules prior to each meal and 2 capsules before bed to prevent diarrhea secondary to irritable bowel syndrome. 20 capsule 0   Magnesium 500 MG CAPS      Melatonin 10 MG TABS Take by mouth.     Multiple Vitamins-Minerals (MULTIVITAMIN ADULTS 50+) TABS Take by mouth.     diclofenac  (VOLTAREN ) 75 MG EC tablet Take 1 tablet (75 mg total) by mouth 2 (two) times daily as needed for moderate pain (pain score 4-6). 30 tablet 0   diphenoxylate -atropine  (LOMOTIL ) 2.5-0.025 MG tablet Take 1 tablet by mouth 4 (four) times daily as needed for diarrhea or loose stools. 30 tablet 0   Facility-Administered Medications Prior to Visit  Medication Dose Route Frequency Provider Last Rate Last Admin   denosumab  (PROLIA ) injection 60 mg  60 mg Subcutaneous Once Zareen Jamison M, MD       No Known Allergies Objective:   Today's Vitals   06/21/24 1015  BP: 130/72  Pulse: 76  Temp: 97.8 F (36.6 C)  TempSrc: Temporal  SpO2: 99%  Weight: 129 lb 12.8 oz (58.9 kg)  Height: 5' 4 (1.626 m)   Body mass index is 22.28 kg/m.   General: Well developed, well nourished. No acute distress. Psych: Alert and oriented. Normal mood and affect.  Health Maintenance Due  Topic Date Due   Medicare Annual Wellness (AWV)  05/20/2022     Lab Results:    Latest Ref Rng & Units 05/11/2024    5:57 PM 05/04/2024    9:12 AM 11/18/2022   10:53 AM  BMP  Glucose 70 - 99 mg/dL 890  97  90   BUN 8 - 23 mg/dL 21  20  20    Creatinine 0.44 - 1.00 mg/dL 9.19  9.15  9.24   Sodium 135 - 145 mmol/L 138  141  139   Potassium 3.5 - 5.1 mmol/L 3.9  3.9  3.7   Chloride 98 - 111 mmol/L 100  102  102   CO2 22 - 32 mmol/L 25  26  29    Calcium 8.9 - 10.3 mg/dL 9.6  9.5  8.9    Assessment & Plan:   Problem List Items Addressed This Visit       Cardiovascular and Mediastinum   Essential  hypertension - Primary   Although her blood pressure was running high 2 months ago, reviewing her home BP log since then shows normal blood pressures. I will continue her on HCTZ 25 mg daily.        Digestive   Lymphocytic colitis   Recent recurrence. Has completed a course of budesonide  with improvement. Avoid NSAIDs in  the future.        Musculoskeletal and Integument   Age-related osteoporosis without current pathological fracture   Last bone density showed some improvement in her T scores, into an osteopenia range. She should continue calcium with Vit. D supplementation. We discussed the pros and cons of holding on other treatments for osteoporosis. We will hold her Prolia  for now. Reviewed home safety and she has appropriate measures in place to reduce risk of falls.       Return in about 6 months (around 12/22/2024) for Annual preventative care.   Garnette CHRISTELLA Simpler, MD

## 2024-06-21 NOTE — Assessment & Plan Note (Signed)
 Recent recurrence. Has completed a course of budesonide  with improvement. Avoid NSAIDs in the future.

## 2024-06-21 NOTE — Assessment & Plan Note (Signed)
 Last bone density showed some improvement in her T scores, into an osteopenia range. She should continue calcium with Vit. D supplementation. We discussed the pros and cons of holding on other treatments for osteoporosis. We will hold her Prolia  for now. Reviewed home safety and she has appropriate measures in place to reduce risk of falls.

## 2024-06-22 ENCOUNTER — Encounter: Payer: Self-pay | Admitting: Family Medicine

## 2024-06-26 ENCOUNTER — Ambulatory Visit (HOSPITAL_BASED_OUTPATIENT_CLINIC_OR_DEPARTMENT_OTHER)
Admission: RE | Admit: 2024-06-26 | Discharge: 2024-06-26 | Disposition: A | Discharge status: Home / Self Care | Source: Ambulatory Visit | Attending: Family Medicine | Admitting: Family Medicine

## 2024-06-26 ENCOUNTER — Encounter (HOSPITAL_BASED_OUTPATIENT_CLINIC_OR_DEPARTMENT_OTHER): Payer: Self-pay

## 2024-06-26 DIAGNOSIS — Z1231 Encounter for screening mammogram for malignant neoplasm of breast: Secondary | ICD-10-CM | POA: Diagnosis not present

## 2024-08-15 DIAGNOSIS — R32 Unspecified urinary incontinence: Secondary | ICD-10-CM | POA: Diagnosis not present

## 2024-08-21 DIAGNOSIS — K52832 Lymphocytic colitis: Secondary | ICD-10-CM | POA: Diagnosis not present

## 2024-08-31 ENCOUNTER — Encounter: Payer: Self-pay | Admitting: Family Medicine

## 2024-08-31 DIAGNOSIS — G5762 Lesion of plantar nerve, left lower limb: Secondary | ICD-10-CM | POA: Diagnosis not present

## 2024-08-31 DIAGNOSIS — M79672 Pain in left foot: Secondary | ICD-10-CM | POA: Diagnosis not present

## 2024-08-31 DIAGNOSIS — M7742 Metatarsalgia, left foot: Secondary | ICD-10-CM | POA: Diagnosis not present

## 2024-09-05 ENCOUNTER — Encounter: Payer: Self-pay | Admitting: Family Medicine

## 2024-09-12 NOTE — Telephone Encounter (Signed)
 Copied from CRM #[id number]. Topic: Clinical - Medical Advice >> Sep 12, 2024  9:24 AM Burnard DEL wrote: Reason for CRM: Patient called in stating that she has sent over two message via mychart to Provider and hasn't heard back in regards to her prolia  injection.Patient would like to know has there been any updates on this?

## 2024-09-12 NOTE — Telephone Encounter (Signed)
 Patient is calling again on this.  Can you help with it?

## 2024-09-18 ENCOUNTER — Telehealth: Payer: Self-pay

## 2024-09-18 ENCOUNTER — Other Ambulatory Visit (HOSPITAL_COMMUNITY): Payer: Self-pay

## 2024-09-18 DIAGNOSIS — D225 Melanocytic nevi of trunk: Secondary | ICD-10-CM | POA: Diagnosis not present

## 2024-09-18 DIAGNOSIS — L209 Atopic dermatitis, unspecified: Secondary | ICD-10-CM | POA: Diagnosis not present

## 2024-09-18 DIAGNOSIS — L821 Other seborrheic keratosis: Secondary | ICD-10-CM | POA: Diagnosis not present

## 2024-09-18 NOTE — Telephone Encounter (Signed)
 MEDICAL PA SUBMITTED VIA [employer]. Authorization Number : [id number]   PHARMACY COPAY: [id number]

## 2024-09-18 NOTE — Telephone Encounter (Signed)
 SABRA

## 2024-09-18 NOTE — Telephone Encounter (Signed)
 Prolia VOB initiated via [email or web address]  Next Prolia inj DUE: NOW

## 2024-09-19 NOTE — Telephone Encounter (Signed)
 Pt ready for scheduling for PROLIA  on or after : 09/19/24  Option# 1: Buy/Bill (Office supplied medication)  Out-of-pocket cost due at time of clinic visit: $357  Number of injection/visits approved: 2  Primary: [employer] Prolia  co-insurance: 20% Admin fee co-insurance: 20%  Secondary: --- Prolia  co-insurance:  Admin fee co-insurance:   Medical Benefit Details: Date Benefits were checked: 09/18/24 Deductible: NO/ Coinsurance: 20%/ Admin Fee: 20%  Prior Auth: APPROVED PA# [id number]  Expiration Date: 09/18/24-09/18/25  # of doses approved: 2 ----------------------------------------------------------------------- Option# 2- Med Obtained from pharmacy:  Pharmacy benefit: Copay $645.79 (Paid to pharmacy) Admin Fee: 20% (Pay at clinic)  Prior Auth: N/A PA# Expiration Date:   # of doses approved:   If patient wants fill through the pharmacy benefit please send prescription to: [facility], and include estimated need by date in rx notes. Pharmacy will ship medication directly to the office.  Patient NOT eligible for Prolia  Copay Card. Copay Card can make patient's cost as little as $25. Link to apply: https://www.amgensupportplus.com/copay  ** This summary of benefits is an estimation of the patient's out-of-pocket cost. Exact cost may very based on individual plan coverage.

## 2024-09-22 ENCOUNTER — Other Ambulatory Visit: Payer: Self-pay

## 2024-09-22 ENCOUNTER — Ambulatory Visit
Admission: RE | Admit: 2024-09-22 | Discharge: 2024-09-22 | Disposition: A | Discharge status: Home / Self Care | Attending: Emergency Medicine | Admitting: Emergency Medicine

## 2024-09-22 VITALS — BP 155/82 | HR 83 | Temp 96.9°F | Resp 16 | Ht 64.0 in | Wt 129.0 lb

## 2024-09-22 DIAGNOSIS — S39012A Strain of muscle, fascia and tendon of lower back, initial encounter: Secondary | ICD-10-CM

## 2024-09-22 MED ORDER — DEXAMETHASONE SOD PHOSPHATE PF 10 MG/ML IJ SOLN
10.0000 mg | Freq: Once | INTRAMUSCULAR | Status: AC
  Administered 2024-09-22: 10 mg via INTRAMUSCULAR

## 2024-09-22 NOTE — Discharge Instructions (Signed)
 It was a pleasure being part of your care today.  I hope you have a good furniture market!  You appear to have strained your low back.  We have given you a steroid injection to help with the pain and inflammation.  You can continue to take Tylenol  as needed every 8 hours for pain.  Heat, gentle stretching and warm compress may help as well.  Symptoms should improve over the next 1 to 2 days.  If no improvement or any changes follow-up with sports medicine.  Return to clinic for any new or urgent symptoms.

## 2024-09-22 NOTE — ED Provider Notes (Signed)
 [facility]    CSN: [id number] Arrival date & time: 09/22/24  9078      History   Chief Complaint Chief Complaint  Patient presents with   Back Pain    Entered by patient    HPI Shelley Snyder is a 83 y.o. female.   Patient presents to clinic over concern of lower back pain that has been present for the past 8 days.  She had emptied out a flower pot and drag it out to her garbage can but needed to lift it into the garbage can.  Upon lifting the flower pot into the garbage can she felt a sudden pull in her back.  Had initially right sided lower back pain.  Over the past week feels like its migrated into the left side.  Has difficulty getting out of the bed first thing in the morning, getting into cars and crossing her legs when sitting.  Pain seems to be better when she is up and moving around.  Pain does not radiate.  Denies any leg numbness or incontinence.  Without recent falls or trauma.  Has tried Tylenol  and lidocaine without much relief.  Ambulatory without difficulty.  Patient with history of osteoporosis and prior lumbar compression fractures of the spine, reports these were years ago.  Is currently on medication for osteoporosis.  The history is provided by the patient and medical records.  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      Home Medications    Prior to Admission medications   Medication Sig Start Date End Date Taking? Authorizing Provider  acetaminophen  (TYLENOL ) 650 MG CR tablet Take 650 mg by mouth every 8 (eight) hours as needed for pain. Up to 2 BID.    [provider]  Ascorbic Acid (VITAMIN C) 1000 MG tablet Take 1,000 mg by mouth daily.    [provider]  Budesonide  ER 9 MG TB24 Take 1 tablet (9 mg total) by mouth daily. 05/11/24   Haviland, Julie, MD  Calcium Carb-Cholecalciferol 600-800 MG-UNIT TABS Take 2 tablets by mouth daily.    [provider]  Cholecalciferol (VITAMIN D -3 PO) Take by mouth.    [provider]  cyanocobalamin (VITAMIN B12) 1000 MCG tablet Take 1,000 mcg by mouth daily.    [provider]  fexofenadine (ALLEGRA) 180 MG tablet Take 180 mg by mouth daily. Patient taking differently: Take 180 mg by mouth as needed.    [provider]  hydrochlorothiazide  (HYDRODIURIL ) 25 MG tablet Take 1 tablet (25 mg total) by mouth daily. 11/18/23   Thedora Garnette HERO, MD  lactobacillus  acidophilus (BACID) TABS tablet Take 1 tablet by mouth daily.    [provider]  loperamide  (IMODIUM ) 2 MG capsule Take 2 capsules (4 mg total) by mouth 4 (four) times daily as needed for diarrhea or loose stools. Take 2 capsules prior to each meal and 2 capsules before bed to prevent diarrhea secondary to irritable bowel syndrome. 05/09/24   Reddick, Johnathan B, NP  Magnesium 500 MG CAPS  04/07/23   [provider]  Melatonin 10 MG TABS Take by mouth.    [provider]  Multiple Vitamins-Minerals (MULTIVITAMIN ADULTS 50+) TABS Take by mouth.    [provider]    Family History Family History  Problem Relation Age of Onset   Cancer Mother        Lung    Hypertension Mother    Cancer Father        Mesothelioma    Social History Social History   Tobacco Use   Smoking status: Former    Current packs/day: 0.00    Average packs/day: 0.3 packs/day for 1 year (0.3 ttl pk-yrs)    Types: Cigarettes    Quit date: 12/07/1984    Years since quitting: 39.8   Smokeless tobacco: Never  Vaping Use   Vaping status: Never Used  Substance Use Topics   Alcohol use: Yes    Alcohol/week: 7.0 standard drinks of alcohol    Types: 7 Glasses of wine per week    Comment: socially   Drug use: No     Allergies   Actonel  [risedronate ] and Nsaids   Review of Systems Review of Systems  Per HPI  Physical Exam Triage Vital Signs ED Triage Vitals [09/22/24 0929]  Encounter Vitals Group     BP (!) 165/95     Girls Systolic BP Percentile      Girls Diastolic BP Percentile      Boys Systolic BP Percentile      Boys Diastolic BP Percentile      Pulse Rate 83     Resp 16     Temp (!) 96.9 F (36.1 C)     Temp Source Oral     SpO2 97 %     Weight 129 lb (58.5 kg)     Height 5' 4 (1.626 m)     Head Circumference      Peak Flow      Pain Score 4     Pain Loc      Pain Education      Exclude from Growth Chart    No data found.  Updated Vital Signs BP (!) 155/82 (BP Location: Right Arm)   Pulse 83   Temp (!) 96.9 F (36.1 C) (Oral)   Resp 16   Ht 5' 4 (1.626 m)   Wt 129 lb (58.5 kg)   SpO2 97%   BMI 22.14 kg/m   Visual Acuity Right Eye Distance:   Left Eye Distance:   Bilateral Distance:    Right Eye Near:   Left Eye Near:    Bilateral Near:     Physical Exam Vitals and nursing note reviewed.  Constitutional:      Appearance: Normal appearance.  HENT:     Head: Normocephalic and atraumatic.     Right Ear: External ear normal.     Left Ear: External ear normal.     Nose: Nose normal.     Mouth/Throat:     Mouth: Mucous membranes are moist.  Eyes:  Conjunctiva/sclera: Conjunctivae normal.  Cardiovascular:      Rate and Rhythm: Normal rate.  Pulmonary:     Effort: Pulmonary effort is normal. No respiratory distress.  Musculoskeletal:        General: No swelling, tenderness, deformity or signs of injury.     Cervical back: Normal.     Thoracic back: Normal.     Lumbar back: Normal range of motion.     Comments: Pain limited to back without radiation with left-sided straight leg raise, suspicious for musculoskeletal etiology rather than nerve root pathology.  Skin:    General: Skin is warm and dry.     Findings: No rash.  Neurological:     General: No focal deficit present.     Mental Status: She is alert and oriented to person, place, and time.  Psychiatric:        Mood and Affect: Mood normal.        Behavior: Behavior normal.      UC Treatments / Results  Labs (all labs ordered are listed, but only abnormal results are displayed) Labs Reviewed - No data to display  EKG   Radiology No results found.  Procedures Procedures (including critical care time)  Medications Ordered in UC Medications  dexamethasone  (DECADRON ) injection 10 mg (has no administration in time range)    Initial Impression / Assessment and Plan / UC Course  I have reviewed the triage vital signs and the nursing notes.  Pertinent labs & imaging results that were available during my care of the patient were reviewed by me and considered in my medical decision making (see chart for details).  Vitals and triage reviewed, patient is hemodynamically stable.  Atraumatic back pain, imaging deferred.  Without cervical, thoracic or lumbar spinal tenderness to palpation.  Without overlying rashes.  Pain in the left sided lower back is triggered with crossing legs and getting out of the bed in the morning, suspicious for MSK etiology.  Without radiation, and or leg numbness or incontinence.  Low concern for cauda equina or nerve root pathology.  Will treat for soft tissue etiology, trial IM Decadron  for  inflammation.  Symptomatic management for soft tissue back pain discussed.  Orthopedic follow-up encouraged.  Strict return precautions given.  Patient verbalized understanding, no questions at this time.     Final Clinical Impressions(s) / UC Diagnoses   Final diagnoses:  Low back strain, initial encounter     Discharge Instructions      It was a pleasure being part of your care today.  I hope you have a good furniture market!  You appear to have strained your low back.  We have given you a steroid injection to help with the pain and inflammation.  You can continue to take Tylenol  as needed every 8 hours for pain.  Heat, gentle stretching and warm compress may help as well.  Symptoms should improve over the next 1 to 2 days.  If no improvement or any changes follow-up with sports medicine.  Return to clinic for any new or urgent symptoms.     ED Prescriptions   None    PDMP not reviewed this encounter.   Dreama Madisen Ludvigsen  N, FNP 09/22/24 1013

## 2024-09-22 NOTE — ED Triage Notes (Signed)
 Pt presents with a chief complaint of lower back pain x 1 week. Brought in a flower pot to her house, it was heavy, and felt like she may have pulled a muscle. Pain began after this. Denies pain in triage. Feels the back pain when lifting her left leg. Started on the right side, has now radiated to the left. OTC Tylenol  taken at home. Not much improvement/relief with this. Lidocaine cream also applied.

## 2024-10-02 ENCOUNTER — Encounter: Payer: Self-pay | Admitting: Family Medicine

## 2024-10-03 ENCOUNTER — Encounter: Payer: Self-pay | Admitting: Family Medicine

## 2024-10-03 ENCOUNTER — Ambulatory Visit

## 2024-10-03 DIAGNOSIS — M810 Age-related osteoporosis without current pathological fracture: Secondary | ICD-10-CM

## 2024-10-03 MED ORDER — DENOSUMAB 60 MG/ML ~~LOC~~ SOSY
60.0000 mg | PREFILLED_SYRINGE | Freq: Once | SUBCUTANEOUS | Status: AC
  Administered 2024-10-03: 60 mg via SUBCUTANEOUS

## 2024-10-03 NOTE — Progress Notes (Signed)
 Patient is in office today for a nurse visit for a Prolia  injection. Injection was placed in back of left arm subcutaneous by Laymon Gladis Sharps, CMA. Patient tolerated injection well.

## 2024-10-03 NOTE — Telephone Encounter (Signed)
 Prolia  arrived to the office. Office-supplied. $357 due at check in.

## 2024-10-04 ENCOUNTER — Ambulatory Visit: Admitting: Family Medicine

## 2024-10-04 VITALS — BP 154/79 | Ht 64.0 in | Wt 130.0 lb

## 2024-10-04 DIAGNOSIS — M7742 Metatarsalgia, left foot: Secondary | ICD-10-CM | POA: Diagnosis not present

## 2024-10-04 DIAGNOSIS — G5762 Lesion of plantar nerve, left lower limb: Secondary | ICD-10-CM | POA: Diagnosis not present

## 2024-10-04 DIAGNOSIS — M79672 Pain in left foot: Secondary | ICD-10-CM | POA: Diagnosis not present

## 2024-10-04 DIAGNOSIS — M545 Low back pain, unspecified: Secondary | ICD-10-CM

## 2024-10-04 MED ORDER — TRAMADOL HCL 50 MG PO TABS: 50.0000 mg | ORAL_TABLET | Freq: Four times a day (QID) | ORAL | 0 refills | Status: DC | PRN

## 2024-10-04 NOTE — Patient Instructions (Signed)
  VISIT SUMMARY: Today, we discussed your persistent back pain and its possible causes. We also reviewed your current osteoporosis management and made some adjustments to help manage your pain more effectively.  YOUR PLAN: -LOW BACK PAIN: Your persistent low back pain may be due to a muscle strain, a herniated disc, or a compression fracture. We will do x-rays of your lumbar spine to check for a compression fracture. In the meantime, you can take tramadol  for severe pain, and we will provide you with home exercises to help manage your back pain. Duwaine, our athletic trainer, will guide you through these exercises.  -AGE-RELATED OSTEOPOROSIS WITHOUT CURRENT PATHOLOGICAL FRACTURE: Osteoporosis is a condition where your bones become weak and are more likely to break. You are currently managing this with Prolia , and we will continue this treatment. We are also considering the possibility of a compression fracture due to your back pain.  INSTRUCTIONS: Please get your lumbar spine x-rays done as soon as possible so we can have the results by tomorrow morning. Continue taking Prolia  for your osteoporosis and follow the home exercises provided by [facility]. Use tramadol  for severe pain as needed.

## 2024-10-05 ENCOUNTER — Ambulatory Visit (HOSPITAL_BASED_OUTPATIENT_CLINIC_OR_DEPARTMENT_OTHER)
Admission: RE | Admit: 2024-10-05 | Discharge: 2024-10-05 | Disposition: A | Discharge status: Home / Self Care | Source: Ambulatory Visit | Attending: Family Medicine | Admitting: Family Medicine

## 2024-10-05 ENCOUNTER — Encounter: Payer: Self-pay | Admitting: Family Medicine

## 2024-10-05 DIAGNOSIS — M47816 Spondylosis without myelopathy or radiculopathy, lumbar region: Secondary | ICD-10-CM | POA: Diagnosis not present

## 2024-10-05 DIAGNOSIS — M545 Low back pain, unspecified: Secondary | ICD-10-CM | POA: Insufficient documentation

## 2024-10-05 DIAGNOSIS — R29890 Loss of height: Secondary | ICD-10-CM | POA: Diagnosis not present

## 2024-10-05 DIAGNOSIS — M4316 Spondylolisthesis, lumbar region: Secondary | ICD-10-CM | POA: Diagnosis not present

## 2024-10-05 DIAGNOSIS — M47817 Spondylosis without myelopathy or radiculopathy, lumbosacral region: Secondary | ICD-10-CM | POA: Diagnosis not present

## 2024-10-05 NOTE — Progress Notes (Signed)
 PCP: Thedora Garnette HERO, MD  Discussed the use of AI scribe software for clinical note transcription with the patient, who gave verbal consent to proceed.  History of Present Illness Shelley Snyder is an 83 year old female with osteoporosis who presents with persistent back pain.  Back pain - Persistent pain for three weeks, initially on the right side, now more severe on the left - Localized to the middle back without radiation to the legs or sciatica - Pain worsens by the end of the day with a sensation of compression - Difficulty getting out of bed due to pain - Requires pillows for support when sleeping - Recent increase in sitting time due to work at [employer], possibly contributing to discomfort - Uncertainty regarding safe exercises; performs some stretching  Analgesic use and response - Received a steroid injection two weeks ago with persistent pain - Uses heat and Tylenol  (500 mg, two tablets, four times daily, not exceeding 4000 mg) with limited relief - NSAID use limited due to history of microscopic colitis - Has used tramadol  for pain in the past  Osteoporosis management - Takes Prolia  for osteoporosis    Past Medical History:  Diagnosis Date   Allergy    Arthritis     Current Outpatient Medications on File Prior to Visit  Medication Sig Dispense Refill   acetaminophen  (TYLENOL ) 650 MG CR tablet Take 650 mg by mouth every 8 (eight) hours as needed for pain. Up to 2 BID.     Ascorbic Acid (VITAMIN C) 1000 MG tablet Take 1,000 mg by mouth daily.     Budesonide  ER 9 MG TB24 Take 1 tablet (9 mg total) by mouth daily. 30 tablet 0   Calcium Carb-Cholecalciferol 600-800 MG-UNIT TABS Take 2 tablets by mouth daily.     Cholecalciferol (VITAMIN D -3 PO) Take by mouth.     cyanocobalamin (VITAMIN B12) 1000 MCG tablet Take 1,000 mcg by mouth daily.     fexofenadine (ALLEGRA) 180 MG tablet Take 180 mg by mouth daily. (Patient taking differently: Take 180 mg by mouth as needed.)      hydrochlorothiazide  (HYDRODIURIL ) 25 MG tablet Take 1 tablet (25 mg total) by mouth daily. 90 tablet 3   lactobacillus acidophilus (BACID) TABS tablet Take 1 tablet by mouth daily.     loperamide  (IMODIUM ) 2 MG capsule Take 2 capsules (4 mg total) by mouth 4 (four) times daily as needed for diarrhea or loose stools. Take 2 capsules prior to each meal and 2 capsules before bed to prevent diarrhea secondary to irritable bowel syndrome. 20 capsule 0   Magnesium 500 MG CAPS      Melatonin 10 MG TABS Take by mouth.     Multiple Vitamins-Minerals (MULTIVITAMIN ADULTS 50+) TABS Take by mouth.     Current Facility-Administered Medications on File Prior to Visit  Medication Dose Route Frequency Provider Last Rate Last Admin   denosumab  (PROLIA ) injection 60 mg  60 mg Subcutaneous Once Thedora Garnette HERO, MD        Past Surgical History:  Procedure Laterality Date   ABDOMINAL HYSTERECTOMY     aprtial   BLADDER REPAIR     bladder tact   BREAST SURGERY     PERINEAL PROCTECTOMY     RECTAL PROLAPSE REPAIR     TONSILLECTOMY      Allergies  Allergen Reactions   Actonel  [Risedronate ] Other (See Comments)    Myalgias and joint pain   Nsaids Other (See Comments)    Triggers lymphocytic colitis  BP (!) 154/79   Ht 5' 4 (1.626 m)   Wt 130 lb (59 kg)   BMI 22.31 kg/m       No data to display              No data to display              Objective:  Physical Exam:  Gen: NAD, comfortable in exam room  Back: No gross deformity, scoliosis. No paraspinal TTP.  No midline or bony TTP. FROM. Strength LEs 5/5 all muscle groups.   2+ MSRs in patellar and achilles tendons, equal bilaterally. Negative SLRs. Sensation intact to light touch bilaterally.   Assessment & Plan Low back pain Persistent low back pain for three weeks, muscular in nature, atypical due to lack of improvement. Differential includes muscular strain, herniated disc, and compression fracture. - Order lumbar  spine x-rays to rule out compression fracture. - Prescribe tramadol  for severe pain management due to NSAID contraindication. - Provide home exercises for back pain management with guidance from athletic trainer Megan.  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 - Continue Prolia  for osteoporosis management.

## 2024-10-06 ENCOUNTER — Encounter: Payer: Self-pay | Admitting: Family Medicine

## 2024-10-06 MED ORDER — DENOSUMAB 60 MG/ML ~~LOC~~ SOSY: 60.0000 mg | PREFILLED_SYRINGE | Freq: Once | SUBCUTANEOUS | Status: DC

## 2024-10-06 MED ORDER — DENOSUMAB 60 MG/ML ~~LOC~~ SOSY: 60.0000 mg | PREFILLED_SYRINGE | Freq: Once | SUBCUTANEOUS | Status: AC

## 2024-10-06 NOTE — Addendum Note (Signed)
 Addended by: GLADIS CLAUDENE GRATE Y on: 10/06/2024 03:21 PM   Modules accepted: Orders

## 2024-10-06 NOTE — Addendum Note (Signed)
 Addended by: GLADIS CLAUDENE GRATE Y on: 10/06/2024 03:28 PM   Modules accepted: Orders

## 2024-10-07 ENCOUNTER — Encounter: Payer: Self-pay | Admitting: Family Medicine

## 2024-10-09 ENCOUNTER — Other Ambulatory Visit: Payer: Self-pay | Admitting: Family Medicine

## 2024-10-09 ENCOUNTER — Encounter: Payer: Self-pay | Admitting: Radiology

## 2024-10-09 DIAGNOSIS — R32 Unspecified urinary incontinence: Secondary | ICD-10-CM | POA: Diagnosis not present

## 2024-10-09 NOTE — Telephone Encounter (Signed)
 See other patient message.

## 2024-10-10 ENCOUNTER — Other Ambulatory Visit: Payer: Self-pay

## 2024-10-10 DIAGNOSIS — M545 Low back pain, unspecified: Secondary | ICD-10-CM

## 2024-10-10 MED ORDER — TRAMADOL HCL 50 MG PO TABS: 50.0000 mg | ORAL_TABLET | Freq: Four times a day (QID) | ORAL | 0 refills | Status: AC | PRN

## 2024-10-17 NOTE — Addendum Note (Signed)
 Addended by: MARTHA BOUCHARD E on: 10/17/2024 03:26 PM   Modules accepted: Orders

## 2024-10-17 NOTE — Progress Notes (Signed)
 Pt has an upcoming appt at [facility], asking for us  to fax her order to them @ fax # [phone]  [facility] couldn't get her in for a few weeks.

## 2024-10-18 ENCOUNTER — Encounter: Payer: Self-pay | Admitting: Family Medicine

## 2024-10-25 ENCOUNTER — Ambulatory Visit: Admitting: Physical Therapy

## 2024-10-27 ENCOUNTER — Other Ambulatory Visit: Payer: Self-pay

## 2024-10-27 DIAGNOSIS — M545 Low back pain, unspecified: Secondary | ICD-10-CM

## 2024-10-30 DIAGNOSIS — M545 Low back pain, unspecified: Secondary | ICD-10-CM | POA: Diagnosis not present

## 2024-11-01 ENCOUNTER — Encounter: Payer: Self-pay | Admitting: Family Medicine

## 2024-11-07 DIAGNOSIS — R3915 Urgency of urination: Secondary | ICD-10-CM | POA: Diagnosis not present

## 2024-11-07 DIAGNOSIS — N393 Stress incontinence (female) (male): Secondary | ICD-10-CM | POA: Diagnosis not present

## 2024-11-12 ENCOUNTER — Ambulatory Visit (HOSPITAL_BASED_OUTPATIENT_CLINIC_OR_DEPARTMENT_OTHER): Admission: RE | Admit: 2024-11-12 | Discharge: 2024-11-12 | Attending: Family Medicine | Admitting: Family Medicine

## 2024-11-12 DIAGNOSIS — M545 Low back pain, unspecified: Secondary | ICD-10-CM

## 2024-11-13 DIAGNOSIS — M545 Low back pain, unspecified: Secondary | ICD-10-CM | POA: Diagnosis not present

## 2024-11-14 ENCOUNTER — Ambulatory Visit: Payer: Self-pay | Admitting: Family Medicine

## 2024-11-21 NOTE — Assessment & Plan Note (Signed)
We will check annual lipids today.

## 2024-11-21 NOTE — Assessment & Plan Note (Signed)
 I will continue her on HCTZ 25 mg daily.

## 2024-11-21 NOTE — Assessment & Plan Note (Signed)
 Recent osteoporotic fracture of the L1 vertebrae. She should continue calcium with Vit. D supplementation. Recommend ongoing Prolia  injections. I will check her Vitamin D  level today.

## 2024-11-21 NOTE — Progress Notes (Incomplete)
 [facility] [address] [place] [address] Dept: [phone] Dept Fax: [phone]  Annual Physical Visit  Subjective:    Patient ID: Shelley Snyder, female    DOB: 1941-10-31, 83 y.o..   MRN: [id number]  No chief complaint on file.  History of Present Illness:  Patient is in today for an annual physical/preventative visit.  Ms. Eisen has essential hypertension. She is managed on HCTZ 25 mg daily.     [name] has a history of osteoporosis with a prior compression fracture of the spine. She is managed on denosumab  (Prolia ) injections every 6 months. She also takes a calcium with Vit. D supplement.    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 Up to 2 BID.     Ascorbic Acid (VITAMIN C) 1000 MG tablet Take 1,000 mg by mouth daily.     Budesonide  ER 9 MG TB24 Take 1 tablet (9 mg total) by mouth daily. 30 tablet 0   Calcium  Carb-Cholecalciferol 600-800 MG-UNIT TABS Take 2 tablets by mouth daily.     Cholecalciferol (VITAMIN D -3 PO) Take by mouth.     cyanocobalamin (VITAMIN B12) 1000 MCG tablet Take 1,000 mcg by mouth daily.     fexofenadine (ALLEGRA) 180 MG tablet Take 180 mg by mouth daily. (Patient taking differently: Take 180 mg by mouth as needed.)     hydrochlorothiazide  (HYDRODIURIL ) 25 MG tablet Take 1 tablet (25 mg total) by mouth daily. 90 tablet 3   lactobacillus acidophilus (BACID) TABS tablet Take 1 tablet by mouth daily.     loperamide  (IMODIUM ) 2 MG capsule Take 2 capsules (4 mg total) by mouth 4 (four) times daily as needed for diarrhea or loose stools. Take 2 capsules prior to each meal and 2 capsules before bed to prevent diarrhea secondary to irritable bowel syndrome. 20 capsule 0   Magnesium 500 MG CAPS      Melatonin 10 MG TABS Take by mouth.     Multiple Vitamins-Minerals (MULTIVITAMIN ADULTS 50+) TABS Take by mouth.     traMADol  (ULTRAM ) 50 MG tablet Take 1 tablet (50 mg total) by mouth every 6 (six) hours as needed. 40 tablet 0   Facility-Administered Medications Prior to Visit  Medication Dose Route Frequency Provider Last Rate Last Admin   denosumab  (PROLIA ) injection 60 mg  60 mg Subcutaneous Once Tamaira Ciriello,  Garnette HERO, MD       [START ON 04/03/2025] denosumab  (PROLIA ) injection 60 mg  60 mg Subcutaneous Once Chukwuma Straus M, MD       Allergies[1] Objective:   There were no vitals filed for this visit. There is no height or weight on file to calculate BMI.   General: Well developed, well nourished. No acute distress. HEENT: Normocephalic, non-traumatic. PERRL, EOMI. Conjunctiva clear. External ears normal. EAC and TMs normal bilaterally. Nose    clear without congestion or rhinorrhea. Mucous membranes moist. Oropharynx clear. Good dentition. Neck: Supple. No lymphadenopathy. No thyromegaly. Lungs: Clear to auscultation bilaterally. No wheezing, rales or rhonchi. CV: RRR without murmurs or  rubs. Pulses 2+ bilaterally. Abdomen: Soft, non-tender. Bowel sounds positive, normal pitch and frequency. No hepatosplenomegaly. No rebound or guarding. Back: Straight. No CVA tenderness bilaterally. Extremities: Full ROM. No joint swelling or tenderness. No edema noted. Skin: Warm and dry. No rashes. Neuro: CN II-XII intact. Normal sensation and DTR bilaterally. Psych: Alert and oriented. Normal mood and affect.  Health Maintenance Due  Topic Date Due   Medicare Annual Wellness (AWV)  05/20/2022    Imaging  MR Lumbar Spine Wo Contrast Result Date: 11/14/2024 IMPRESSION:  1. Acute or subacute compression fracture of the inferior endplate of L1 2. 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 No significant spinal stenosis   DG Lumbar Spine 2-3 Views Result Date: 10/05/2024 IMPRESSION:  1. Progression of height loss at L1. This is age-indeterminate, but new since 04/27/21.  2. Unchanged chronic height loss at all other lumbar levels.    Lab Results {Labs (Optional):29002}  Lipid Panel:  Lab Results  Component Value Date   CHOL 214 (H) 11/18/2023   HDL 82.30 11/18/2023   LDLCALC 116 (H) 11/18/2023   TRIG 76.0 11/18/2023     Assessment & Plan:   Problem List Items Addressed This Visit   None   No follow-ups on file.   Garnette HERO Simpler, MD  I,Emily Lagle,acting as a scribe for Garnette HERO Simpler, MD.,have documented all relevant documentation on the behalf of Garnette HERO Simpler, MD.  I, Garnette HERO Simpler, MD, have reviewed all documentation for this visit. The documentation on 11/22/2024 for the exam, diagnosis, procedures, and orders are all accurate and complete.    [1]  Allergies Allergen Reactions   Actonel  [Risedronate ] Other (See Comments)    Myalgias and joint pain   Nsaids Other (See Comments)    Triggers lymphocytic colitis

## 2024-11-22 ENCOUNTER — Ambulatory Visit: Admitting: Family Medicine

## 2024-11-22 ENCOUNTER — Ambulatory Visit: Payer: Self-pay | Admitting: Family Medicine

## 2024-11-22 ENCOUNTER — Encounter: Payer: Self-pay | Admitting: Family Medicine

## 2024-11-22 ENCOUNTER — Telehealth: Payer: Self-pay

## 2024-11-22 VITALS — BP 128/76 | HR 78 | Temp 97.5°F | Ht 64.0 in | Wt 128.4 lb

## 2024-11-22 VITALS — BP 138/86 | Ht 64.0 in | Wt 128.0 lb

## 2024-11-22 DIAGNOSIS — M8000XD Age-related osteoporosis with current pathological fracture, unspecified site, subsequent encounter for fracture with routine healing: Secondary | ICD-10-CM | POA: Diagnosis not present

## 2024-11-22 DIAGNOSIS — I10 Essential (primary) hypertension: Secondary | ICD-10-CM

## 2024-11-22 DIAGNOSIS — M545 Low back pain, unspecified: Secondary | ICD-10-CM

## 2024-11-22 DIAGNOSIS — Z0000 Encounter for general adult medical examination without abnormal findings: Secondary | ICD-10-CM | POA: Diagnosis not present

## 2024-11-22 DIAGNOSIS — M810 Age-related osteoporosis without current pathological fracture: Secondary | ICD-10-CM

## 2024-11-22 DIAGNOSIS — E785 Hyperlipidemia, unspecified: Secondary | ICD-10-CM | POA: Diagnosis not present

## 2024-11-22 LAB — LIPID PANEL
Cholesterol: 201 mg/dL — ABNORMAL HIGH (ref 28–200)
HDL: 85 mg/dL (ref >39.00)
LDL Cholesterol: 101 mg/dL — ABNORMAL HIGH (ref 10–99)
NonHDL: 115.83
Total CHOL/HDL Ratio: 2
Triglycerides: 76 mg/dL (ref 10.0–149.0)
VLDL: 15.2 mg/dL (ref 0.0–40.0)

## 2024-11-22 LAB — VITAMIN D 25 HYDROXY (VIT D DEFICIENCY, FRACTURES): VITD: >120 ng/mL

## 2024-11-22 MED ORDER — HYDROCHLOROTHIAZIDE 25 MG PO TABS: 25.0000 mg | ORAL_TABLET | Freq: Every day | ORAL | 3 refills | Status: AC

## 2024-11-22 NOTE — Telephone Encounter (Signed)
 CRITICAL VALUE STICKER  CRITICAL VALUE: Vit D >120  RECEIVER (on-site recipient of call): Christopherjohn Schiele  DATE & TIME NOTIFIED: 11/22/24 1402  MESSENGER (representative from lab): Domnick  MD NOTIFIED: Rudd  TIME OF NOTIFICATION: 11/22/14 1405  RESPONSE:  Provider aware for review.

## 2024-11-22 NOTE — Assessment & Plan Note (Signed)
 Overall health is good. Recommend regular exercise as tolerated due to recent spinal fracture. Discussed recommended screenings and immunizations.

## 2024-11-23 NOTE — Progress Notes (Signed)
 PCP: Thedora Garnette HERO, MD  Patient is a 83 y.o. female here for low back pain.  HPI Patient reports she's doing better. She is taking tylenol  less than previously. Taking tramadol  now very rarely. Has been going to physical therapy - modified after results of MRI came back and this most recent sessions seems to have helped the most. She's doing home exercises also. Now about 9-10 weeks out from start of pain.  Past Medical History:  Diagnosis Date   Allergy    Arthritis     Medications Ordered Prior to Encounter[1]  Past Surgical History:  Procedure Laterality Date   ABDOMINAL HYSTERECTOMY     aprtial   BLADDER REPAIR     bladder tact   BREAST SURGERY     PERINEAL PROCTECTOMY     RECTAL PROLAPSE REPAIR     TONSILLECTOMY      Allergies[2]  BP 138/86   Ht 5' 4 (1.626 m)   Wt 128 lb (58.1 kg)   BMI 21.97 kg/m       No data to display              No data to display              Objective:  Physical Exam:  Gen: NAD, comfortable in exam room  Rest of exam not repeated today.  Assessment and Plan:  L1 compression fracture - likely acute on chronic.  Now 9-10 weeks out from this.  No red flag signs or symptoms.  Pain improving.  Continue physical therapy for 4 more sessions.  To avoid full extent of stretching of back.  Tylenol  with tramadol  as needed.  Follow up in 1 month only if needed.    [1]  Current Outpatient Medications on File Prior to Visit  Medication Sig Dispense Refill   acetaminophen  (TYLENOL ) 650 MG CR tablet Take 650 mg by mouth every 8 (eight) hours as needed for pain. Up to 2 BID.     Ascorbic Acid (VITAMIN C) 1000 MG tablet Take 1,000 mg by mouth daily.     Budesonide  ER 9 MG TB24 Take 1 tablet (9 mg total) by mouth daily. 30 tablet 0   Calcium Carb-Cholecalciferol 600-800 MG-UNIT TABS Take 2 tablets by mouth daily.     Cholecalciferol (VITAMIN D -3 PO) Take by mouth.     cyanocobalamin (VITAMIN B12) 1000 MCG tablet Take 1,000 mcg  by mouth daily.     fexofenadine (ALLEGRA) 180 MG tablet Take 180 mg by mouth daily.     lactobacillus acidophilus (BACID) TABS tablet Take 1 tablet by mouth daily.     loperamide  (IMODIUM ) 2 MG capsule Take 2 capsules (4 mg total) by mouth 4 (four) times daily as needed for diarrhea or loose stools. Take 2 capsules prior to each meal and 2 capsules before bed to prevent diarrhea secondary to irritable bowel syndrome. 20 capsule 0   Magnesium 500 MG CAPS      Melatonin 10 MG TABS Take by mouth.     Multiple Vitamins-Minerals (MULTIVITAMIN ADULTS 50+) TABS Take by mouth.     traMADol  (ULTRAM ) 50 MG tablet Take 1 tablet (50 mg total) by mouth every 6 (six) hours as needed. 40 tablet 0   hydrochlorothiazide  (HYDRODIURIL ) 25 MG tablet Take 1 tablet (25 mg total) by mouth daily. 90 tablet 3   Current Facility-Administered Medications on File Prior to Visit  Medication Dose Route Frequency Provider Last Rate Last Admin   denosumab  (PROLIA ) injection 60  mg  60 mg Subcutaneous Once Thedora Garnette HERO, MD       NOREEN ON 04/03/2025] denosumab  (PROLIA ) injection 60 mg  60 mg Subcutaneous Once Thedora Garnette HERO, MD      [2]  Allergies Allergen Reactions   Actonel  [email or web address] ] Other (See Comments)    Myalgias and joint pain   Nsaids Other (See Comments)    Triggers lymphocytic colitis

## 2024-12-03 ENCOUNTER — Encounter: Payer: Self-pay | Admitting: Family Medicine

## 2024-12-04 NOTE — Telephone Encounter (Signed)
Please review message and advise.   Thanks. Dm/cma  

## 2024-12-08 ENCOUNTER — Encounter: Payer: Self-pay | Admitting: Family Medicine

## 2024-12-08 ENCOUNTER — Ambulatory Visit (INDEPENDENT_AMBULATORY_CARE_PROVIDER_SITE_OTHER): Admitting: Family Medicine

## 2024-12-08 VITALS — BP 130/76 | HR 95 | Temp 98.1°F | Ht 64.0 in | Wt 131.4 lb

## 2024-12-08 DIAGNOSIS — M8000XD Age-related osteoporosis with current pathological fracture, unspecified site, subsequent encounter for fracture with routine healing: Secondary | ICD-10-CM | POA: Diagnosis not present

## 2024-12-08 DIAGNOSIS — E678 Other specified hyperalimentation: Secondary | ICD-10-CM | POA: Insufficient documentation

## 2024-12-08 DIAGNOSIS — M8000XA Age-related osteoporosis with current pathological fracture, unspecified site, initial encounter for fracture: Secondary | ICD-10-CM | POA: Insufficient documentation

## 2024-12-08 NOTE — Assessment & Plan Note (Signed)
 Ms. Esty should continue her current Prolia  injections. I recommend she be taking 1,200 mg daily of calcium and 800 IU (20 mcg) daily of Vitamin D . Her current regimen has too low a dose of calcium (only 300 mg) and far exceeds the RDA for Vitamin D  (10,000 + IU). I recommend she stop the K2-D3 supplement and take a calcium-Vitamin D  600 mg-400 IU 2 tabs daily. She can consider taking her multivitamin as well.

## 2024-12-08 NOTE — Progress Notes (Signed)
 " [facility] [address] [place] [address] Dept: [phone] Dept Fax: [phone]  Office Visit  Subjective:    Patient ID: Shelley Snyder, female    DOB: 01-26-1941, 84 y.o..   MRN: [id number]  Chief Complaint  Patient presents with   Follow-up    F/u to discuss Vitamin d  supplements.    History of Present Illness:  Patient is in today for osteoporosis.   [name] has a history of osteoporosis with a prior compression fracture of the spine. She is managed on denosumab  (Prolia ) injections every 6 months. Due to some back pain, she has had a recent MRI scan which showed an acute/subacute L1 compression fracture. On 12/03/2024, she reached out to me to discuss what vitamin-d and calcium dosage she should be taking. She presents today for us  to review her supplement regimen. She is taking a multivitamin (including, calcium, Vitamin D , Vitamin K, Vitamin C, magnesium, and B-vitamins). In addition, she has been taking additional supplements of K2-D3, Vitamin C, magnesium, and B-vitamins with C.On her current regimen, she is exceeding RDAs for all of these drugs.  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 Up to 2 BID.     Ascorbic Acid (VITAMIN C) 1000 MG tablet Take 1,000 mg by mouth daily.     Budesonide  ER 9 MG TB24 Take 1 tablet (9 mg total) by mouth daily. 30 tablet 0   Calcium Carb-Cholecalciferol 600-800 MG-UNIT TABS Take 2 tablets by mouth daily.     Cholecalciferol (VITAMIN D -3 PO) Take by mouth.     cyanocobalamin (VITAMIN B12) 1000 MCG tablet Take 1,000 mcg by mouth daily.     fexofenadine (ALLEGRA) 180 MG tablet Take 180 mg by mouth daily.     hydrochlorothiazide  (HYDRODIURIL ) 25 MG tablet Take 1 tablet (25 mg total) by mouth daily. 90 tablet 3   lactobacillus acidophilus (BACID) TABS tablet Take 1 tablet by mouth daily.     loperamide  (IMODIUM ) 2 MG capsule Take 2 capsules (4 mg total) by mouth 4 (four) times daily as needed for diarrhea or loose stools. Take 2 capsules prior to each meal and 2 capsules before bed to prevent diarrhea secondary to irritable bowel syndrome. 20 capsule 0   Magnesium 500 MG CAPS      Melatonin 10 MG TABS Take by mouth.     Multiple Vitamins-Minerals (MULTIVITAMIN ADULTS 50+)  TABS Take by mouth.     traMADol  (ULTRAM ) 50 MG tablet Take 1 tablet (50 mg total) by mouth every 6 (six) hours as needed. 40 tablet 0   Facility-Administered Medications Prior to Visit  Medication Dose Route Frequency Provider Last Rate Last Admin   denosumab  (PROLIA ) injection 60 mg  60 mg Subcutaneous Once Thedora Garnette HERO, MD       [START ON 04/03/2025] denosumab  (PROLIA ) injection 60 mg  60 mg Subcutaneous Once Thedora Garnette HERO, MD       Allergies[1]   Objective:   Today's Vitals   12/08/24 1545  BP: 130/76  Pulse: 95  Temp: 98.1 F (36.7 C)  TempSrc: Temporal  SpO2: 97%  Weight: 131 lb 6.4 oz (59.6 kg)  Height: 5' 4 (1.626 m)   Body mass index  is 22.55 kg/m.   General: Well developed, well nourished. No acute distress. Psych: Alert and oriented. Normal mood and affect.  Health Maintenance Due  Topic Date Due   Medicare Annual Wellness (AWV)  05/20/2022     Assessment & Plan:   Problem List Items Addressed This Visit       Musculoskeletal and Integument   Age-related osteoporosis with current pathological fracture   [name] should continue her current Prolia  injections. I recommend she be taking 1,200 mg daily of calcium and 800 IU (20 mcg) daily of Vitamin D . Her current regimen has too low a dose of calcium (only 300 mg) and far exceeds the RDA for Vitamin D  (10,000 + IU). I recommend she stop the K2-D3 supplement and take a calcium-Vitamin D  600 mg-400 IU 2 tabs daily. She can consider taking her multivitamin as well.        Other   Hypervitaminosis - Primary   [name] is taking a combined amount of B vitamins, vitamin K, and vitamin C that far exceed the RDA. I recommend she stop the B-complex, K2-D3, and vitamin C supplements. She can continue her use of a multivitamin instead.       Return for Follow-up as scheduled.    Garnette HERO Thedora, MD  I,Emily Lagle,acting as a scribe for Garnette HERO Thedora, MD.,have documented all relevant documentation on the behalf of Garnette HERO Thedora, MD.  I, Garnette HERO Thedora, MD, have reviewed all documentation for this visit. The documentation on 12/08/2024 for the exam, diagnosis, procedures, and orders are all accurate and complete.     [1]  Allergies Allergen Reactions   Actonel  [Risedronate ] Other (See Comments)    Myalgias and joint pain   Nsaids Other (See Comments)    Triggers lymphocytic colitis   "

## 2024-12-08 NOTE — Assessment & Plan Note (Signed)
 Shelley Snyder is taking a combined amount of B vitamins, vitamin K, and vitamin C that far exceed the RDA. I recommend she stop the B-complex, K2-D3, and vitamin C supplements. She can continue her use of a multivitamin instead.

## 2025-05-28 ENCOUNTER — Ambulatory Visit
